# Patient Record
Sex: Female | Born: 1970 | Race: White | Hispanic: No | Marital: Single | Smoking: Former smoker
Health system: Southern US, Community
[De-identification: ages and names within clinical notes are randomized; demographics above are authoritative.]

## PROBLEM LIST (undated history)

## (undated) DIAGNOSIS — F32A Depression, unspecified: Secondary | ICD-10-CM

## (undated) DIAGNOSIS — L409 Psoriasis, unspecified: Secondary | ICD-10-CM

## (undated) DIAGNOSIS — M419 Scoliosis, unspecified: Secondary | ICD-10-CM

## (undated) DIAGNOSIS — M5136 Other intervertebral disc degeneration, lumbar region: Secondary | ICD-10-CM

## (undated) DIAGNOSIS — D259 Leiomyoma of uterus, unspecified: Secondary | ICD-10-CM

## (undated) DIAGNOSIS — N809 Endometriosis, unspecified: Secondary | ICD-10-CM

## (undated) DIAGNOSIS — M47814 Spondylosis without myelopathy or radiculopathy, thoracic region: Secondary | ICD-10-CM

## (undated) DIAGNOSIS — M51369 Other intervertebral disc degeneration, lumbar region without mention of lumbar back pain or lower extremity pain: Secondary | ICD-10-CM

## (undated) HISTORY — DX: Scoliosis, unspecified: M41.9

## (undated) HISTORY — DX: Depression, unspecified: F32.A

## (undated) HISTORY — DX: Psoriasis, unspecified: L40.9

---

## 2000-07-18 ENCOUNTER — Other Ambulatory Visit: Admission: RE | Admit: 2000-07-18 | Discharge: 2000-07-18 | Payer: Self-pay | Admitting: Obstetrics and Gynecology

## 2001-08-17 ENCOUNTER — Other Ambulatory Visit: Admission: RE | Admit: 2001-08-17 | Discharge: 2001-08-17 | Payer: Self-pay | Admitting: Obstetrics and Gynecology

## 2002-08-29 ENCOUNTER — Other Ambulatory Visit: Admission: RE | Admit: 2002-08-29 | Discharge: 2002-08-29 | Payer: Self-pay | Admitting: Obstetrics and Gynecology

## 2003-10-28 ENCOUNTER — Encounter
Admission: RE | Admit: 2003-10-28 | Discharge: 2003-12-31 | Payer: Self-pay | Admitting: Physical Medicine & Rehabilitation

## 2005-12-08 ENCOUNTER — Ambulatory Visit: Payer: Self-pay | Admitting: Licensed Clinical Social Worker

## 2005-12-23 ENCOUNTER — Ambulatory Visit: Payer: Self-pay | Admitting: Licensed Clinical Social Worker

## 2006-01-06 ENCOUNTER — Ambulatory Visit: Payer: Self-pay | Admitting: Licensed Clinical Social Worker

## 2006-01-10 ENCOUNTER — Ambulatory Visit: Payer: Self-pay | Admitting: Licensed Clinical Social Worker

## 2006-01-11 ENCOUNTER — Ambulatory Visit: Payer: Self-pay | Admitting: Internal Medicine

## 2006-01-20 ENCOUNTER — Emergency Department (HOSPITAL_COMMUNITY): Admission: EM | Admit: 2006-01-20 | Discharge: 2006-01-20 | Payer: Self-pay | Admitting: Emergency Medicine

## 2006-01-26 ENCOUNTER — Other Ambulatory Visit (HOSPITAL_COMMUNITY): Admission: RE | Admit: 2006-01-26 | Discharge: 2006-04-26 | Payer: Self-pay | Admitting: Psychiatry

## 2006-01-27 ENCOUNTER — Ambulatory Visit: Payer: Self-pay | Admitting: Internal Medicine

## 2006-02-08 ENCOUNTER — Ambulatory Visit: Payer: Self-pay | Admitting: Psychiatry

## 2006-03-29 ENCOUNTER — Ambulatory Visit: Payer: Self-pay | Admitting: Psychiatry

## 2006-07-24 ENCOUNTER — Ambulatory Visit: Payer: Self-pay | Admitting: Internal Medicine

## 2006-10-09 ENCOUNTER — Ambulatory Visit (HOSPITAL_COMMUNITY): Payer: Self-pay | Admitting: Psychiatry

## 2006-10-12 ENCOUNTER — Ambulatory Visit (HOSPITAL_COMMUNITY): Admission: RE | Admit: 2006-10-12 | Discharge: 2006-10-12 | Payer: Self-pay | Admitting: Obstetrics and Gynecology

## 2006-11-21 ENCOUNTER — Ambulatory Visit (HOSPITAL_COMMUNITY): Payer: Self-pay | Admitting: Psychiatry

## 2006-12-15 DIAGNOSIS — F329 Major depressive disorder, single episode, unspecified: Secondary | ICD-10-CM

## 2007-02-06 ENCOUNTER — Ambulatory Visit (HOSPITAL_COMMUNITY): Payer: Self-pay | Admitting: Psychiatry

## 2008-12-16 ENCOUNTER — Emergency Department (HOSPITAL_COMMUNITY): Admission: EM | Admit: 2008-12-16 | Discharge: 2008-12-16 | Payer: Self-pay | Admitting: Emergency Medicine

## 2010-05-16 ENCOUNTER — Encounter: Payer: Self-pay | Admitting: Obstetrics and Gynecology

## 2010-07-31 LAB — URINALYSIS, ROUTINE W REFLEX MICROSCOPIC
Glucose, UA: NEGATIVE mg/dL
Specific Gravity, Urine: 1.03 (ref 1.005–1.030)
pH: 5 (ref 5.0–8.0)

## 2010-07-31 LAB — URINE MICROSCOPIC-ADD ON
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
## 2010-09-10 NOTE — Group Therapy Note (Signed)
REFERRING PHYSICIAN:  Patrica Duel, M.D.   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  She  was treated in a thoracolumbar sacral orthosis for four years while a  teenager.  Has had onset of back pain since her senior year of high school,  which has been intermittent since that time.  More recently, she has  developed leg pain, but no associated falls, night pain, or loss of bowel  and bladder function.  She has had what she describes as a combination of  pain, numbness, and weakness of the legs, which she states averages 3/10.  She was having worse pain before, but now that her primary doctor has her on  a combination of Ultram 50 mg one p.o. t.i.d. and Cymbalta 60 mg p.o. daily,  these symptoms have largely improved.  Her pain is improved with rest, heat,  and medication and made worse with excessive walking, bending, sitting, and  working.  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   In the past, she has had a sacroiliac injection per Dr. Luiz Blare, which helped  back in 2001.  She has had an open MRI of the lumbar spine without  intravenous contrast on Aug 31, 2002, showing some degenerative disk disease  at L5-S1 and a central subligamentous protrusion at L5-S1 without evidence  of neural compression.   She does not recall any EMG or nerve conduction studies.   Other past medical history significant for depression and anxiety which did  not respond very well to Lexapro, but responded well to Cymbalta.   MEDICAL SENSITIVITIES:  NAPROXEN causes some diarrhea.   FAMILY HISTORY:  Significant for heart disease, cancer, and high blood  pressure.   HABITS:  Smokes a half of a pack a day for 10 years.  No history of illegal  drug use, drug conviction, DUIs, or alcohol abuse.   REVIEW OF SYSTEMS:  Anxiety and depression largely improved since Cymbalta.  Weakness and numbness symptoms as described above.  Spasms in the legs  mainly  between the knees and hips.  Swelling, excessive sweating, and some  weight gain, but admits to decreased activity level.   PHYSICAL EXAMINATION:  Blood pressure 139/72, pulse 111, respiratory rate  14, O2 saturation 98%.  Gait is normal.  Affect is alert.  Appearance is  normal.  BACK:  She does have a scoliotic curve most noticeable in a forward  flexed position.  She was examined in the presence of Darlen Round,  R.N.  She is convexed to the right in the thoracic area and to the left in  the lumbar area.  She has been measured with spine films at 35 degrees  thoracic curve and 27 degrees lumbar curve.   She has full strength in bilateral upper and lower extremities.  She has no  tenderness to palpation over the fibromyalgia tender points.  She has no  evident swelling in the knees, hips, ankles, hands, or wrists.  She has full  range of motion in bilateral upper and lower extremities.  She has normal  deep tendon reflexes in bilateral upper and lower extremities.  She is able  to do toe walk and heel walk without difficulty.  Her spine range of motion  is full.  She feels a bit stiff, however, with hyperextension, but appears  to have full range of motion.  Her Faber's test is positive at the right hip  and left  SI joint with passive right hip external rotation.   IMPRESSION:  Chronic low back pain in the setting of history of juvenile  scoliosis.  The most likely etiology would be other lumbar facet arthropathy  versus sacroiliac joint arthropathy.  She states she does have a leg length  discrepancy as a setup for this.  No evidence of significant radicular  symptoms or discogenic symptoms.  No evidence of fibromyalgia or myofascial  pain syndrome.   RECOMMENDATIONS:  1. I think she is well controlled on her current medication regimen and     these should prove good long-term medications for her.  2. She can benefit from a core stabilization program.  Her last physical      therapy was done in 2001 or 2002 and consisted of traction, which she did     not tolerate very well, as well as some exercises, but she does not     recall getting any intensive core strengthening program.  This could be     followed up with a more community-based Pilates or yoga program.  I also     think she can benefit from an aerobic exercise program and will have the     therapist address this with her.  3. I will see her back after she finishes up with her physical therapy.     Will likely not need any further visits after that if all goes well.  I     think her medications can be managed by Dr. Nobie Putnam as he has been doing     and she is comfortable with this as well.     Erick Colace, M.D.   AEK/MedQ  D:  11/27/2003 14:33:22  T:  11/27/2003 15:10:46  Job #:  [id number]   cc:   Patrica Duel, M.D.  [address]  [place]  [place] [address]  Fax: [phone]   Harvie Junior, M.D.  [address]  [place]  [place] [address]  Fax: [phone]   Claris Gower, P.T.  [facility]

## 2010-11-29 ENCOUNTER — Other Ambulatory Visit (HOSPITAL_COMMUNITY): Payer: Self-pay | Admitting: Internal Medicine

## 2010-11-29 DIAGNOSIS — M549 Dorsalgia, unspecified: Secondary | ICD-10-CM

## 2010-12-01 ENCOUNTER — Ambulatory Visit (HOSPITAL_COMMUNITY)
Admission: RE | Admit: 2010-12-01 | Discharge: 2010-12-01 | Disposition: A | Discharge status: Home / Self Care | Payer: BC Managed Care – PPO | Source: Ambulatory Visit | Attending: Internal Medicine | Admitting: Internal Medicine

## 2010-12-01 DIAGNOSIS — M51379 Other intervertebral disc degeneration, lumbosacral region without mention of lumbar back pain or lower extremity pain: Secondary | ICD-10-CM | POA: Insufficient documentation

## 2010-12-01 DIAGNOSIS — M545 Low back pain, unspecified: Secondary | ICD-10-CM | POA: Insufficient documentation

## 2010-12-01 DIAGNOSIS — M79609 Pain in unspecified limb: Secondary | ICD-10-CM | POA: Insufficient documentation

## 2010-12-01 DIAGNOSIS — M5137 Other intervertebral disc degeneration, lumbosacral region: Secondary | ICD-10-CM | POA: Insufficient documentation

## 2010-12-01 DIAGNOSIS — M549 Dorsalgia, unspecified: Secondary | ICD-10-CM

## 2013-03-26 ENCOUNTER — Other Ambulatory Visit (HOSPITAL_COMMUNITY): Payer: Self-pay | Admitting: Internal Medicine

## 2013-03-26 DIAGNOSIS — N949 Unspecified condition associated with female genital organs and menstrual cycle: Secondary | ICD-10-CM

## 2013-03-28 ENCOUNTER — Ambulatory Visit (HOSPITAL_COMMUNITY): Payer: BC Managed Care – PPO | Attending: Internal Medicine

## 2013-06-07 ENCOUNTER — Other Ambulatory Visit: Payer: Self-pay | Admitting: Family

## 2014-12-25 ENCOUNTER — Ambulatory Visit (INDEPENDENT_AMBULATORY_CARE_PROVIDER_SITE_OTHER): Payer: Self-pay | Admitting: Obstetrics & Gynecology

## 2014-12-25 ENCOUNTER — Other Ambulatory Visit (HOSPITAL_COMMUNITY)
Admission: RE | Admit: 2014-12-25 | Discharge: 2014-12-25 | Disposition: A | Discharge status: Home / Self Care | Payer: Self-pay | Source: Ambulatory Visit | Attending: Obstetrics & Gynecology | Admitting: Obstetrics & Gynecology

## 2014-12-25 ENCOUNTER — Encounter: Payer: Self-pay | Admitting: Obstetrics & Gynecology

## 2014-12-25 VITALS — BP 110/80 | HR 72 | Ht 62.0 in | Wt 131.0 lb

## 2014-12-25 DIAGNOSIS — N926 Irregular menstruation, unspecified: Secondary | ICD-10-CM

## 2014-12-25 DIAGNOSIS — Z1151 Encounter for screening for human papillomavirus (HPV): Secondary | ICD-10-CM | POA: Insufficient documentation

## 2014-12-25 DIAGNOSIS — Z01419 Encounter for gynecological examination (general) (routine) without abnormal findings: Secondary | ICD-10-CM | POA: Insufficient documentation

## 2014-12-25 DIAGNOSIS — N941 Dyspareunia: Secondary | ICD-10-CM

## 2014-12-25 DIAGNOSIS — D259 Leiomyoma of uterus, unspecified: Secondary | ICD-10-CM

## 2014-12-25 DIAGNOSIS — Z124 Encounter for screening for malignant neoplasm of cervix: Secondary | ICD-10-CM

## 2014-12-25 DIAGNOSIS — IMO0002 Reserved for concepts with insufficient information to code with codable children: Secondary | ICD-10-CM

## 2014-12-25 DIAGNOSIS — R102 Pelvic and perineal pain: Secondary | ICD-10-CM

## 2014-12-25 MED ORDER — HYDROCODONE-ACETAMINOPHEN 5-325 MG PO TABS: 1.0000 | ORAL_TABLET | Freq: Four times a day (QID) | ORAL | Status: DC | PRN

## 2014-12-25 NOTE — Progress Notes (Signed)
Patient ID: [name] [name], female   DOB: 04/25/1971, 44 y.o.   MRN: [id number] Chief Complaint  Patient presents with  . new gyn visit    no period since end of May. cramp pain stomach and hip pain.    Blood pressure 110/80, pulse 72, height 5\' 2"  (1.575 m), weight 131 lb (59.421 kg), last menstrual period 09/22/2014.  44 y.o. No obstetric history on file. Patient's last menstrual period was 09/22/2014. The current method of family planning is vasectomy.  Subjective Pt with increasing pelvic pain 1 year or so, increasing in frequency and intensity Achy now sharp affects back and hips Was told some years ago she had fibroids unsure of size +dyspareunia Periods are regularly irregular  Objective Abdomen soft non distended +BS mass effect felt deep Vulva:  normal appearing vulva with no masses, tenderness or lesions Vagina:  normal mucosa, no discharge Cervix:  no bleeding following Pap, no cervical motion tenderness, no lesions and Pap obtained Uterus:  enlarged posteriorly displaced, somewhat soft like degenerating or adenomyosis 16 weeks size Adnexa: ovaries:present,  Hard to tell if uterus or ovaries are fillling pelvis but mass from sidewall to sidewall    Pertinent ROS No burning with urination, frequency or urgency No nausea, vomiting or diarrhea Nor fever chills or other constitutional symptoms   Labs or studies Sonogram to be done next week   Current outpatient prescriptions:  .  ibuprofen (ADVIL,MOTRIN) 800 MG tablet, Take 800 mg by mouth every 8 (eight) hours as needed., Disp: , Rfl:  .  HYDROcodone-acetaminophen (NORCO/VICODIN) 5-325 MG per tablet, Take 1 tablet by mouth every 6 (six) hours as needed., Disp: 30 tablet, Rfl: 0  Impression Diagnoses this Encounter::   ICD-9-CM ICD-10-CM   1. Uterine leiomyoma, unspecified location 218.9 D25.9 Cytology - PAP     US Pelvis Complete     US Transvaginal Non-OB  2. Pap smear for cervical cancer screening V76.2 Z12.4    3. Pelvic pain in female 625.9 R10.2   4. Dyspareunia 625.0 N94.1   5. Irregular periods 626.4 N92.6     Established relevant diagnosis(es): none  Plan/Recommendations: Meds ordered this encounter  Medications  . ibuprofen (ADVIL,MOTRIN) 800 MG tablet    Sig: Take 800 mg by mouth every 8 (eight) hours as needed.  [name] HYDROcodone-acetaminophen (NORCO/VICODIN) 5-325 MG per tablet    Sig: Take 1 tablet by mouth every 6 (six) hours as needed.    Dispense:  30 tablet    Refill:  0    Labs or Scans Ordered: Orders Placed This Encounter  Procedures  . US Pelvis Complete  . US Transvaginal Non-OB      Follow up Return in about 1 week (around 01/01/2015) for GYN sono, Follow up, with Dr Elonda Husky.         All questions were answered.  Past Medical History  Diagnosis Date  . Scoliosis     History reviewed. No pertinent past surgical history.  OB History    No data available      Allergies  Allergen Reactions  . Meperidine Hcl     Social History   Social History  . Marital Status: Married    Spouse Name: N/A  . Number of Children: N/A  . Years of Education: N/A   Social History Main Topics  . Smoking status: Current Every Day Smoker -- 1.00 packs/day    Types: Cigarettes  . Smokeless tobacco: Current User  . Alcohol Use: None  . Drug Use:  None  . Sexual Activity: Yes   Other Topics Concern  . None   Social History Narrative  . None    Family History  Problem Relation Age of Onset  . Hypertension Mother   . Other Mother     lumpectomy  . Heart disease Mother   . Other Sister     lumpectomy  . Heart disease Sister

## 2014-12-31 ENCOUNTER — Telehealth: Payer: Self-pay | Admitting: Obstetrics & Gynecology

## 2014-12-31 ENCOUNTER — Telehealth: Payer: Self-pay | Admitting: *Deleted

## 2014-12-31 LAB — CYTOLOGY - PAP

## 2014-12-31 MED ORDER — TRAMADOL HCL 50 MG PO TABS: 50.0000 mg | ORAL_TABLET | Freq: Four times a day (QID) | ORAL | Status: DC | PRN

## 2014-12-31 NOTE — Telephone Encounter (Signed)
Pt states hydrocodone caused swelling, itching, and rash. Requesting Tramadol instead. Please advise.

## 2014-12-31 NOTE — Telephone Encounter (Signed)
Allergic to hydrocodone Switched at pt request to tramadol

## 2014-12-31 NOTE — Telephone Encounter (Signed)
Pt informed Tramadol RX at front for pick up.

## 2015-01-01 ENCOUNTER — Ambulatory Visit (INDEPENDENT_AMBULATORY_CARE_PROVIDER_SITE_OTHER): Payer: Self-pay

## 2015-01-01 ENCOUNTER — Encounter: Payer: Self-pay | Admitting: Obstetrics & Gynecology

## 2015-01-01 ENCOUNTER — Ambulatory Visit (INDEPENDENT_AMBULATORY_CARE_PROVIDER_SITE_OTHER): Payer: Self-pay | Admitting: Obstetrics & Gynecology

## 2015-01-01 VITALS — BP 118/60 | HR 76 | Wt 132.4 lb

## 2015-01-01 DIAGNOSIS — D259 Leiomyoma of uterus, unspecified: Secondary | ICD-10-CM

## 2015-01-01 DIAGNOSIS — R102 Pelvic and perineal pain: Secondary | ICD-10-CM

## 2015-01-01 DIAGNOSIS — N9419 Other specified dyspareunia: Secondary | ICD-10-CM

## 2015-01-01 DIAGNOSIS — N941 Dyspareunia: Secondary | ICD-10-CM

## 2015-01-01 NOTE — Progress Notes (Signed)
Patient ID: [name] [name], female   DOB: Apr 06, 1971, 44 y.o.   MRN: [id number] Preoperative History and Physical  [name] [name] is a 44 y.o. No obstetric history on file. with Patient's last menstrual period was 09/22/2014. admitted for a abdominal hysterecomy(with removal of the cervix) bilateral salpingectomy and preservation of the ovaries.  Pt with increasing pelvic pain 1 year or so, increasing in frequency and intensity Achy now sharp affects back and hips Was told some years ago she had fibroids unsure of size +dyspareunia Periods are regularly irregular  See sonogram report below  PMH:    Past Medical History  Diagnosis Date  . Scoliosis     PSH:    History reviewed. No pertinent past surgical history.  POb/GynH:      OB History    No data available      SH:   Social History  Substance Use Topics  . Smoking status: Current Every Day Smoker -- 1.00 packs/day    Types: Cigarettes  . Smokeless tobacco: Current User  . Alcohol Use: None    FH:    Family History  Problem Relation Age of Onset  . Hypertension Mother   . Other Mother     lumpectomy  . Heart disease Mother   . Other Sister     lumpectomy  . Heart disease Sister      Allergies:  Allergies  Allergen Reactions  . Meperidine Hcl     Medications:       Current outpatient prescriptions:  .  ibuprofen (ADVIL,MOTRIN) 800 MG tablet, Take 800 mg by mouth every 8 (eight) hours as needed., Disp: , Rfl:  .  traMADol (ULTRAM) 50 MG tablet, Take 1 tablet (50 mg total) by mouth every 6 (six) hours as needed., Disp: 30 tablet, Rfl: 0  Review of Systems:   Review of Systems  Constitutional: Negative for fever, chills, weight loss, malaise/fatigue and diaphoresis.  HENT: Negative for hearing loss, ear pain, nosebleeds, congestion, sore throat, neck pain, tinnitus and ear discharge.   Eyes: Negative for blurred vision, double vision, photophobia, pain, discharge and redness.  Respiratory: Negative for cough,  hemoptysis, sputum production, shortness of breath, wheezing and stridor.   Cardiovascular: Negative for chest pain, palpitations, orthopnea, claudication, leg swelling and PND.  Gastrointestinal: Positive for abdominal pain. Negative for heartburn, nausea, vomiting, diarrhea, constipation, blood in stool and melena.  Genitourinary: Negative for dysuria, urgency, frequency, hematuria and flank pain.  Musculoskeletal: Negative for myalgias, back pain, joint pain and falls.  Skin: Negative for itching and rash.  Neurological: Negative for dizziness, tingling, tremors, sensory change, speech change, focal weakness, seizures, loss of consciousness, weakness and headaches.  Endo/Heme/Allergies: Negative for environmental allergies and polydipsia. Does not bruise/bleed easily.  Psychiatric/Behavioral: Negative for depression, suicidal ideas, hallucinations, memory loss and substance abuse. The patient is not nervous/anxious and does not have insomnia.      PHYSICAL EXAM:  Blood pressure 118/60, pulse 76, weight 132 lb 6.4 oz (60.056 kg), last menstrual period 09/22/2014.    Vitals reviewed. Constitutional: She is oriented to person, place, and time. She appears well-developed and well-nourished.  HENT:  Head: Normocephalic and atraumatic.  Right Ear: External ear normal.  Left Ear: External ear normal.  Nose: Nose normal.  Mouth/Throat: Oropharynx is clear and moist.  Eyes: Conjunctivae and EOM are normal. Pupils are equal, round, and reactive to light. Right eye exhibits no discharge. Left eye exhibits no discharge. No scleral icterus.  Neck: Normal range of  motion. Neck supple. No tracheal deviation present. No thyromegaly present.  Cardiovascular: Normal rate, regular rhythm, normal heart sounds and intact distal pulses.  Exam reveals no gallop and no friction rub.   No murmur heard. Respiratory: Effort normal and breath sounds normal. No respiratory distress. She has no wheezes. She has no  rales. She exhibits no tenderness.  GI: Soft. Bowel sounds are normal. She exhibits no distension and no mass. There is tenderness. There is no rebound and no guarding.  Genitourinary:       Vulva is normal without lesions Vagina is pink moist without discharge Cervix normal in appearance and pap is normal Uterus is 10 weeks size filling pelvis side to side Adnexa is negative with normal sized ovaries by sonogram  Musculoskeletal: Normal range of motion. She exhibits no edema and no tenderness.  Neurological: She is alert and oriented to person, place, and time. She has normal reflexes. She displays normal reflexes. No cranial nerve deficit. She exhibits normal muscle tone. Coordination normal.  Skin: Skin is warm and dry. No rash noted. No erythema. No pallor.  Psychiatric: She has a normal mood and affect. Her behavior is normal. Judgment and thought content normal.    Labs: Results for orders placed or performed in visit on 12/25/14 (from the past 336 hour(s))  Cytology - PAP   Collection Time: 12/25/14 12:00 AM  Result Value Ref Range   CYTOLOGY - PAP PAP RESULT     EKG: No orders found for this or any previous visit.  Imaging Studies: US Transvaginal Non-ob  01-08-2015   GYNECOLOGIC SONOGRAM   [name] [name] is a 44 y.o.Marland KitchenLMP 09/22/2014 for a pelvic sonogram for pelvic  mass.  Uterus                      9.6 x 5.3 x 5.8 cm,  heterogenous retroverted  uterus w/ mult fibroid  Endometrium          8.5 mm, symmetrical wnl  Right ovary             2.9 x 3.1 x 2.9 cm, wnl  Left ovary                3.5 x 2.2 x 1.07 cm, wnl    Technician Comments:  US PELVIC TA/TV: heterogenous retroverted uterus w/ mult fibroid, largest  fibroids #1) fundal pedunculated fibroid 1.8 x 1.3 x 1.7cm,(#2)post rt  intramural fibroid 2.4 x 1.3 x 2.3cm,LUS intramural fibroid 2.9 x 2.3 x 3  cm,normal ov's bilat (mobile),EEC 8.50mm    [name] 08-Jan-2015 3:05 PM  Clinical Impression and recommendations:  I have reviewed  the sonogram results above, combined with the patient's  current clinical course, below are my impressions and any appropriate  recommendations for management based on the sonographic findings.  Multiple fibroids present Ovaries are normal Endometrium normal   Christinia Lambeth H 01-08-15 3:29 PM    US Pelvis Complete  01-08-15   GYNECOLOGIC SONOGRAM   [name] [name] is a 44 y.o.Marland KitchenLMP 09/22/2014 for a pelvic sonogram for pelvic  mass.  Uterus                      9.6 x 5.3 x 5.8 cm,  heterogenous retroverted  uterus w/ mult fibroid  Endometrium          8.5 mm, symmetrical wnl  Right ovary  2.9 x 3.1 x 2.9 cm, wnl  Left ovary                3.5 x 2.2 x 1.07 cm, wnl    Technician Comments:  US PELVIC TA/TV: heterogenous retroverted uterus w/ mult fibroid, largest  fibroids #1) fundal pedunculated fibroid 1.8 x 1.3 x 1.7cm,(#2)post rt  intramural fibroid 2.4 x 1.3 x 2.3cm,LUS intramural fibroid 2.9 x 2.3 x 3  cm,normal ov's bilat (mobile),EEC 8.19mm    [name] 01/01/2015 3:05 PM  Clinical Impression and recommendations:  I have reviewed the sonogram results above, combined with the patient's  current clinical course, below are my impressions and any appropriate  recommendations for management based on the sonographic findings.  Multiple fibroids present Ovaries are normal Endometrium normal   Lynnet Hefley H 01/01/2015 3:29 PM       Assessment: Uterine leiomyoma, unspecified location  Pelvic pain in female  Dyspareunia   Plan: Plan abdominal hysterectomy and removal of both Fallopian tubes on 01/21/2015, plan is to preserve the ovaries  Pt understands the risks of surgery including but not limited t  excessive bleeding requiring transfusion or reoperation, post-operative infection requiring prolonged hospitalization or re-hospitalization and antibiotic therapy, and damage to other organs including bladder, bowel, ureters and major vessels.  The patient also understands the alternative treatment  options which were discussed in full.  All questions were answered.  Jabir Dahlem H 01/01/2015 3:41 PM   Jenean Escandon H 01/01/2015 3:40 PM

## 2015-01-01 NOTE — Progress Notes (Signed)
US PELVIC TA/TV: heterogenous retroverted uterus w/ mult fibroid, largest fibroids #1) fundal pedunculated fibroid 1.8 x 1.3 x 1.7cm,(#2)post rt intramural fibroid 2.4 x 1.3 x 2.3cm,LUS intramural fibroid 2.9 x 2.3 x 3 cm,normal ov's bilat (mobile),EEC 8.29mm

## 2015-01-05 ENCOUNTER — Other Ambulatory Visit: Payer: Self-pay | Admitting: Obstetrics & Gynecology

## 2015-01-11 ENCOUNTER — Other Ambulatory Visit: Payer: Self-pay | Admitting: Obstetrics & Gynecology

## 2015-01-13 ENCOUNTER — Telehealth: Payer: Self-pay | Admitting: *Deleted

## 2015-01-13 ENCOUNTER — Other Ambulatory Visit: Payer: Self-pay | Admitting: Obstetrics & Gynecology

## 2015-01-13 NOTE — Telephone Encounter (Signed)
It was e prescribed 1 week ago, 9/13

## 2015-01-13 NOTE — Telephone Encounter (Signed)
Pt informed Dr. Elonda Husky did not refill Tramadol. Pt states what else could Dr. Elonda Husky recommend for the abdominal pain until her surgery on 01/21/2015? Pt states she has had to take 5 tablets of Tramadol daily to endure the pain.

## 2015-01-13 NOTE — Telephone Encounter (Signed)
Tramadol prescription was just refilled 01/06/2015

## 2015-01-14 ENCOUNTER — Telehealth: Payer: Self-pay | Admitting: Obstetrics & Gynecology

## 2015-01-14 MED ORDER — KETOROLAC TROMETHAMINE 10 MG PO TABS: 10.0000 mg | ORAL_TABLET | Freq: Three times a day (TID) | ORAL | Status: DC | PRN

## 2015-01-14 NOTE — Telephone Encounter (Signed)
If she continues to take that much pain meds nothing is going to work post operatively, i e prescribed toradol for her which is non narcotic

## 2015-01-15 NOTE — Telephone Encounter (Signed)
Pt informed Toradol for pain, e-scribed to [facility], [place].

## 2015-01-16 NOTE — Patient Instructions (Signed)
Alexiah Kerman Passey  01/16/2015     @PREFPERIOPPHARMACY @   Your procedure is scheduled on  01/21/2015  Report to [facility] at  700  A.M.  Call this number if you have problems the morning of surgery:  [phone]   Remember:  Do not eat food or drink liquids after midnight.  Take these medicines the morning of surgery with A SIP OF WATER toradol, ultram.   Do not wear jewelry, make-up or nail polish.  Do not wear lotions, powders, or perfumes.  You may wear deodorant.  Do not shave 48 hours prior to surgery.  Men may shave face and neck.  Do not bring valuables to the hospital.  [facility] is not responsible for any belongings or valuables.  Contacts, dentures or bridgework may not be worn into surgery.  Leave your suitcase in the car.  After surgery it may be brought to your room.  For patients admitted to the hospital, discharge time will be determined by your treatment team.  Patients discharged the day of surgery will not be allowed to drive home.   Name and phone number of your driver:   family Special instructions:  none  Please read over the following fact sheets that you were given. Pain Booklet, Coughing and Deep Breathing, Surgical Site Infection Prevention, Anesthesia Post-op Instructions and Care and Recovery After Surgery      Abdominal Hysterectomy Abdominal hysterectomy is a surgical procedure to remove your womb (uterus). Your uterus is the muscular organ that contains a developing baby. This surgery is done for many reasons. You may need an abdominal hysterectomy if you have cancer, growths (tumors), long-term pain, or bleeding. You may also have this procedure if your uterus has slipped down into your vagina (uterine prolapse). Depending on why you need an abdominal hysterectomy, you may also have other reproductive organs removed. These could include the part of your vagina that connects with your uterus (cervix), the organs that make eggs (ovaries), and  the tubes that connect the ovaries to the uterus (fallopian tubes). LET [facility] CARE PROVIDER KNOW ABOUT:   Any allergies you have.  All medicines you are taking, including vitamins, herbs, eye drops, creams, and over-the-counter medicines.  Previous problems you or members of your family have had with the use of anesthetics.  Any blood disorders you have.  Previous surgeries you have had.  Medical conditions you have. RISKS AND COMPLICATIONS Generally, this is a safe procedure. However, as with any procedure, problems can occur. Infection is the most common problem after an abdominal hysterectomy. Other possible problems include:  Bleeding.  Formation of blood clots that may break free and travel to your lungs.  Injury to other organs near your uterus.  Nerve injury causing nerve pain.  Decreased interest in sex or pain during sexual intercourse. BEFORE THE PROCEDURE  Abdominal hysterectomy is a major surgical procedure. It can affect the way you feel about yourself. Talk to your health care provider about the physical and emotional changes hysterectomy may cause.  You may need to have blood work and X-rays done before surgery.  Quit smoking if you smoke. Ask your health care provider for help if you are struggling to quit.  Stop taking medicines that thin your blood as directed by your health care provider.  You may be instructed to take antibiotic medicines or laxatives before surgery.  Do not eat or drink anything for 6-8 hours before surgery.  Take your  regular medicines with a small sip of water.  Bathe or shower the night or morning before surgery. PROCEDURE  Abdominal hysterectomy is done in the operating room at the hospital.  In most cases, you will be given a medicine that makes you go to sleep (general anesthetic).  The surgeon will make a cut (incision) through the skin in your lower belly.  The incision may be about 5-7 inches long. It may go  side-to-side or up-and-down.  The surgeon will move aside the body tissue that covers your uterus. The surgeon will then carefully take out your uterus along with any of your other reproductive organs that need to be removed.  Bleeding will be controlled with clamps or sutures.  The surgeon will close your incision with sutures or metal clips. AFTER THE PROCEDURE  You will have some pain immediately after the procedure.  You will be given pain medicine in the recovery room.  You will be taken to your hospital room when you have recovered from the anesthesia.  You may need to stay in the hospital for 2-5 days.  You will be given instructions for recovery at home. Document Released: 04/16/2013 Document Reviewed: 04/16/2013 [facility] Patient Information 2015 [place], [place]. This information is not intended to replace advice given to you by your health care provider. Make sure you discuss any questions you have with your health care provider. PATIENT INSTRUCTIONS POST-ANESTHESIA  IMMEDIATELY FOLLOWING SURGERY:  Do not drive or operate machinery for the first twenty four hours after surgery.  Do not make any important decisions for twenty four hours after surgery or while taking narcotic pain medications or sedatives.  If you develop intractable nausea and vomiting or a severe headache please notify your doctor immediately.  FOLLOW-UP:  Please make an appointment with your surgeon as instructed. You do not need to follow up with anesthesia unless specifically instructed to do so.  WOUND CARE INSTRUCTIONS (if applicable):  Keep a dry clean dressing on the anesthesia/puncture wound site if there is drainage.  Once the wound has quit draining you may leave it open to air.  Generally you should leave the bandage intact for twenty four hours unless there is drainage.  If the epidural site drains for more than 36-48 hours please call the anesthesia department.  QUESTIONS?:  Please feel free to call  your physician or the hospital operator if you have any questions, and they will be happy to assist you.

## 2015-01-19 ENCOUNTER — Encounter: Payer: Self-pay | Admitting: Obstetrics and Gynecology

## 2015-01-19 ENCOUNTER — Encounter (HOSPITAL_COMMUNITY): Payer: Self-pay

## 2015-01-19 ENCOUNTER — Telehealth: Payer: Self-pay | Admitting: *Deleted

## 2015-01-19 ENCOUNTER — Encounter (HOSPITAL_COMMUNITY)
Admission: RE | Admit: 2015-01-19 | Discharge: 2015-01-19 | Disposition: A | Discharge status: Home / Self Care | Payer: Self-pay | Source: Ambulatory Visit | Attending: Obstetrics & Gynecology | Admitting: Obstetrics & Gynecology

## 2015-01-19 DIAGNOSIS — Z01818 Encounter for other preprocedural examination: Secondary | ICD-10-CM | POA: Insufficient documentation

## 2015-01-19 DIAGNOSIS — N852 Hypertrophy of uterus: Secondary | ICD-10-CM | POA: Insufficient documentation

## 2015-01-19 DIAGNOSIS — R102 Pelvic and perineal pain: Secondary | ICD-10-CM | POA: Insufficient documentation

## 2015-01-19 HISTORY — DX: Leiomyoma of uterus, unspecified: D25.9

## 2015-01-19 HISTORY — DX: Spondylosis without myelopathy or radiculopathy, thoracic region: M47.814

## 2015-01-19 LAB — CBC
HCT: 39.5 % (ref 36.0–46.0)
HEMOGLOBIN: 13.9 g/dL (ref 12.0–15.0)
MCH: 31.4 pg (ref 26.0–34.0)
MCHC: 35.2 g/dL (ref 30.0–36.0)
MCV: 89.4 fL (ref 78.0–100.0)
PLATELETS: 221 10*3/uL (ref 150–400)
RBC: 4.42 MIL/uL (ref 3.87–5.11)
RDW: 12.6 % (ref 11.5–15.5)
WBC: 11.7 10*3/uL — AB (ref 4.0–10.5)

## 2015-01-19 LAB — HCG, SERUM, QUALITATIVE: PREG SERUM: NEGATIVE

## 2015-01-19 LAB — BASIC METABOLIC PANEL
ANION GAP: 7 (ref 5–15)
BUN: 13 mg/dL (ref 6–20)
CO2: 24 mmol/L (ref 22–32)
Calcium: 8.6 mg/dL — ABNORMAL LOW (ref 8.9–10.3)
Chloride: 109 mmol/L (ref 101–111)
Creatinine, Ser: 0.66 mg/dL (ref 0.44–1.00)
Glucose, Bld: 106 mg/dL — ABNORMAL HIGH (ref 65–99)
POTASSIUM: 3.5 mmol/L (ref 3.5–5.1)
SODIUM: 140 mmol/L (ref 135–145)

## 2015-01-19 LAB — ABO/RH: ABO/RH(D): AB NEG

## 2015-01-19 NOTE — Telephone Encounter (Signed)
Pt aware that note will be in front office for her to pick up tomorrow.

## 2015-01-21 ENCOUNTER — Inpatient Hospital Stay (HOSPITAL_COMMUNITY): Payer: Self-pay | Admitting: Anesthesiology

## 2015-01-21 ENCOUNTER — Observation Stay (HOSPITAL_COMMUNITY)
Admission: RE | Admit: 2015-01-21 | Discharge: 2015-01-22 | Disposition: A | Discharge status: Home / Self Care | Payer: Self-pay | Source: Ambulatory Visit | Attending: Obstetrics & Gynecology | Admitting: Obstetrics & Gynecology

## 2015-01-21 ENCOUNTER — Encounter (HOSPITAL_COMMUNITY)
Admission: RE | Disposition: A | Discharge status: Home / Self Care | Payer: Self-pay | Source: Ambulatory Visit | Attending: Obstetrics & Gynecology

## 2015-01-21 ENCOUNTER — Inpatient Hospital Stay (HOSPITAL_COMMUNITY): Payer: Self-pay

## 2015-01-21 ENCOUNTER — Encounter (HOSPITAL_COMMUNITY): Payer: Self-pay | Admitting: *Deleted

## 2015-01-21 DIAGNOSIS — N801 Endometriosis of ovary: Secondary | ICD-10-CM

## 2015-01-21 DIAGNOSIS — N803 Endometriosis of pelvic peritoneum: Secondary | ICD-10-CM | POA: Insufficient documentation

## 2015-01-21 DIAGNOSIS — D251 Intramural leiomyoma of uterus: Secondary | ICD-10-CM

## 2015-01-21 DIAGNOSIS — N8320 Unspecified ovarian cysts: Secondary | ICD-10-CM

## 2015-01-21 DIAGNOSIS — N840 Polyp of corpus uteri: Secondary | ICD-10-CM

## 2015-01-21 DIAGNOSIS — F17210 Nicotine dependence, cigarettes, uncomplicated: Secondary | ICD-10-CM | POA: Insufficient documentation

## 2015-01-21 DIAGNOSIS — Z09 Encounter for follow-up examination after completed treatment for conditions other than malignant neoplasm: Secondary | ICD-10-CM

## 2015-01-21 DIAGNOSIS — N941 Dyspareunia: Secondary | ICD-10-CM | POA: Insufficient documentation

## 2015-01-21 DIAGNOSIS — N946 Dysmenorrhea, unspecified: Secondary | ICD-10-CM | POA: Insufficient documentation

## 2015-01-21 DIAGNOSIS — R102 Pelvic and perineal pain: Principal | ICD-10-CM | POA: Insufficient documentation

## 2015-01-21 DIAGNOSIS — N830 Follicular cyst of ovary: Secondary | ICD-10-CM

## 2015-01-21 DIAGNOSIS — Z9079 Acquired absence of other genital organ(s): Secondary | ICD-10-CM

## 2015-01-21 DIAGNOSIS — F329 Major depressive disorder, single episode, unspecified: Secondary | ICD-10-CM | POA: Insufficient documentation

## 2015-01-21 DIAGNOSIS — Z79899 Other long term (current) drug therapy: Secondary | ICD-10-CM | POA: Insufficient documentation

## 2015-01-21 DIAGNOSIS — Z90722 Acquired absence of ovaries, bilateral: Secondary | ICD-10-CM

## 2015-01-21 DIAGNOSIS — N838 Other noninflammatory disorders of ovary, fallopian tube and broad ligament: Secondary | ICD-10-CM

## 2015-01-21 DIAGNOSIS — Z90710 Acquired absence of both cervix and uterus: Secondary | ICD-10-CM

## 2015-01-21 HISTORY — PX: ABDOMINAL HYSTERECTOMY: SHX81

## 2015-01-21 HISTORY — PX: SALPINGOOPHORECTOMY: SHX82

## 2015-01-21 SURGERY — HYSTERECTOMY, ABDOMINAL
Anesthesia: General

## 2015-01-21 MED ORDER — ZOLPIDEM TARTRATE 5 MG PO TABS
5.0000 mg | ORAL_TABLET | Freq: Every evening | ORAL | Status: DC | PRN
  Administered 2015-01-22: 5 mg via ORAL
  Filled 2015-01-21: qty 1

## 2015-01-21 MED ORDER — MIDAZOLAM HCL 2 MG/2ML IJ SOLN
1.0000 mg | INTRAMUSCULAR | Status: DC | PRN
  Administered 2015-01-21 (×2): 1 mg via INTRAVENOUS
  Administered 2015-01-21: 2 mg via INTRAVENOUS
  Filled 2015-01-21: qty 2

## 2015-01-21 MED ORDER — FENTANYL CITRATE (PF) 250 MCG/5ML IJ SOLN
INTRAMUSCULAR | Status: AC
  Filled 2015-01-21: qty 25

## 2015-01-21 MED ORDER — THROMBIN 5000 UNITS EX SOLR
CUTANEOUS | Status: AC
  Filled 2015-01-21: qty 5000

## 2015-01-21 MED ORDER — LACTATED RINGERS IV SOLN
INTRAVENOUS | Status: DC
  Administered 2015-01-21 (×3): via INTRAVENOUS

## 2015-01-21 MED ORDER — MIDAZOLAM HCL 2 MG/2ML IJ SOLN
INTRAMUSCULAR | Status: AC
  Filled 2015-01-21: qty 4

## 2015-01-21 MED ORDER — DOCUSATE SODIUM 100 MG PO CAPS
100.0000 mg | ORAL_CAPSULE | Freq: Two times a day (BID) | ORAL | Status: DC
  Administered 2015-01-21 – 2015-01-22 (×2): 100 mg via ORAL
  Filled 2015-01-21 (×2): qty 1

## 2015-01-21 MED ORDER — BISACODYL 10 MG RE SUPP: 10.0000 mg | Freq: Every day | RECTAL | Status: DC | PRN

## 2015-01-21 MED ORDER — PROPOFOL 10 MG/ML IV BOLUS
INTRAVENOUS | Status: AC
  Filled 2015-01-21: qty 20

## 2015-01-21 MED ORDER — ONDANSETRON HCL 4 MG/2ML IJ SOLN
INTRAMUSCULAR | Status: AC
  Filled 2015-01-21: qty 2

## 2015-01-21 MED ORDER — BUPIVACAINE LIPOSOME 1.3 % IJ SUSP
INTRAMUSCULAR | Status: DC | PRN
  Administered 2015-01-21: 20 mL

## 2015-01-21 MED ORDER — ROCURONIUM BROMIDE 50 MG/5ML IV SOLN
INTRAVENOUS | Status: AC
  Filled 2015-01-21: qty 1

## 2015-01-21 MED ORDER — DEXTROSE 5 % IV SOLN
2000.0000 mg | Freq: Once | INTRAVENOUS | Status: AC
  Administered 2015-01-21: 2000 mg via INTRAVENOUS

## 2015-01-21 MED ORDER — ONDANSETRON HCL 4 MG/2ML IJ SOLN: 4.0000 mg | Freq: Once | INTRAMUSCULAR | Status: DC | PRN

## 2015-01-21 MED ORDER — LIDOCAINE HCL 1 % IJ SOLN
INTRAMUSCULAR | Status: DC | PRN
  Administered 2015-01-21: 25 mg via INTRADERMAL

## 2015-01-21 MED ORDER — HYDROMORPHONE HCL 1 MG/ML IJ SOLN
2.0000 mg | INTRAMUSCULAR | Status: DC | PRN
  Administered 2015-01-21 (×3): 2 mg via INTRAVENOUS
  Administered 2015-01-21: 4 mg via INTRAVENOUS
  Administered 2015-01-22 (×2): 2 mg via INTRAVENOUS
  Filled 2015-01-21 (×3): qty 2
  Filled 2015-01-21: qty 4
  Filled 2015-01-21 (×2): qty 2

## 2015-01-21 MED ORDER — OXYCODONE-ACETAMINOPHEN 5-325 MG PO TABS
1.0000 | ORAL_TABLET | ORAL | Status: DC | PRN
  Administered 2015-01-21 – 2015-01-22 (×3): 2 via ORAL
  Administered 2015-01-22: 1 via ORAL
  Filled 2015-01-21 (×4): qty 2

## 2015-01-21 MED ORDER — 0.9 % SODIUM CHLORIDE (POUR BTL) OPTIME
TOPICAL | Status: DC | PRN
  Administered 2015-01-21: 2000 mL
  Administered 2015-01-21: 1000 mL

## 2015-01-21 MED ORDER — BUPIVACAINE LIPOSOME 1.3 % IJ SUSP
INTRAMUSCULAR | Status: AC
  Filled 2015-01-21: qty 20

## 2015-01-21 MED ORDER — ONDANSETRON HCL 4 MG/2ML IJ SOLN
4.0000 mg | Freq: Once | INTRAMUSCULAR | Status: AC
  Administered 2015-01-21: 4 mg via INTRAVENOUS

## 2015-01-21 MED ORDER — FENTANYL CITRATE (PF) 100 MCG/2ML IJ SOLN
INTRAMUSCULAR | Status: DC | PRN
  Administered 2015-01-21: 50 ug via INTRAVENOUS
  Administered 2015-01-21: 150 ug via INTRAVENOUS
  Administered 2015-01-21 (×6): 50 ug via INTRAVENOUS

## 2015-01-21 MED ORDER — CEFAZOLIN SODIUM-DEXTROSE 2-3 GM-% IV SOLR
INTRAVENOUS | Status: AC
  Filled 2015-01-21: qty 50

## 2015-01-21 MED ORDER — SENNOSIDES-DOCUSATE SODIUM 8.6-50 MG PO TABS: 1.0000 | ORAL_TABLET | Freq: Every evening | ORAL | Status: DC | PRN

## 2015-01-21 MED ORDER — GLYCOPYRROLATE 0.2 MG/ML IJ SOLN
INTRAMUSCULAR | Status: AC
  Filled 2015-01-21: qty 3

## 2015-01-21 MED ORDER — KCL IN DEXTROSE-NACL 20-5-0.45 MEQ/L-%-% IV SOLN
INTRAVENOUS | Status: DC
  Administered 2015-01-21 – 2015-01-22 (×3): via INTRAVENOUS

## 2015-01-21 MED ORDER — GLYCOPYRROLATE 0.2 MG/ML IJ SOLN
INTRAMUSCULAR | Status: DC | PRN
  Administered 2015-01-21: 0.4 mg via INTRAVENOUS

## 2015-01-21 MED ORDER — NEOSTIGMINE METHYLSULFATE 10 MG/10ML IV SOLN
INTRAVENOUS | Status: DC | PRN
  Administered 2015-01-21 (×2): 1 mg via INTRAVENOUS

## 2015-01-21 MED ORDER — PROPOFOL 10 MG/ML IV BOLUS
INTRAVENOUS | Status: DC | PRN
  Administered 2015-01-21: 140 mg via INTRAVENOUS

## 2015-01-21 MED ORDER — SODIUM CHLORIDE 0.9 % IV SOLN
8.0000 mg | Freq: Four times a day (QID) | INTRAVENOUS | Status: DC | PRN
  Filled 2015-01-21: qty 4

## 2015-01-21 MED ORDER — SODIUM CHLORIDE 0.9 % IJ SOLN
INTRAMUSCULAR | Status: AC
  Filled 2015-01-21: qty 20

## 2015-01-21 MED ORDER — ROCURONIUM BROMIDE 100 MG/10ML IV SOLN
INTRAVENOUS | Status: DC | PRN
  Administered 2015-01-21: 35 mg via INTRAVENOUS
  Administered 2015-01-21: 5 mg via INTRAVENOUS
  Administered 2015-01-21 (×3): 10 mg via INTRAVENOUS

## 2015-01-21 MED ORDER — SIMETHICONE 80 MG PO CHEW: 80.0000 mg | CHEWABLE_TABLET | Freq: Four times a day (QID) | ORAL | Status: DC | PRN

## 2015-01-21 MED ORDER — MIDAZOLAM HCL 5 MG/5ML IJ SOLN
INTRAMUSCULAR | Status: DC | PRN
  Administered 2015-01-21: 2 mg via INTRAVENOUS

## 2015-01-21 MED ORDER — KETOROLAC TROMETHAMINE 30 MG/ML IJ SOLN
30.0000 mg | Freq: Once | INTRAMUSCULAR | Status: AC
  Administered 2015-01-21: 30 mg via INTRAVENOUS
  Filled 2015-01-21: qty 1

## 2015-01-21 MED ORDER — LIDOCAINE HCL (PF) 1 % IJ SOLN
INTRAMUSCULAR | Status: AC
  Filled 2015-01-21: qty 5

## 2015-01-21 MED ORDER — MIDAZOLAM HCL 2 MG/2ML IJ SOLN
INTRAMUSCULAR | Status: AC
  Filled 2015-01-21: qty 2

## 2015-01-21 MED ORDER — NEOSTIGMINE METHYLSULFATE 10 MG/10ML IV SOLN
INTRAVENOUS | Status: AC
  Filled 2015-01-21: qty 1

## 2015-01-21 MED ORDER — ONDANSETRON HCL 4 MG PO TABS
8.0000 mg | ORAL_TABLET | Freq: Four times a day (QID) | ORAL | Status: DC | PRN
  Filled 2015-01-21: qty 2

## 2015-01-21 MED ORDER — BUPIVACAINE LIPOSOME 1.3 % IJ SUSP
20.0000 mL | Freq: Once | INTRAMUSCULAR | Status: DC
  Filled 2015-01-21: qty 20

## 2015-01-21 MED ORDER — FENTANYL CITRATE (PF) 100 MCG/2ML IJ SOLN
25.0000 ug | INTRAMUSCULAR | Status: DC | PRN
  Administered 2015-01-21 (×3): 50 ug via INTRAVENOUS
  Filled 2015-01-21 (×2): qty 2

## 2015-01-21 SURGICAL SUPPLY — 50 items
APPLIER CLIP 13 LRG OPEN (CLIP) ×4
APR CLP LRG 13 20 CLIP (CLIP) ×2
BAG HAMPER (MISCELLANEOUS) ×4 IMPLANT
CELLS DAT CNTRL 66122 CELL SVR (MISCELLANEOUS) ×2 IMPLANT
CLIP APPLIE 13 LRG OPEN (CLIP) IMPLANT
CLOTH BEACON ORANGE TIMEOUT ST (SAFETY) ×4 IMPLANT
COVER LIGHT HANDLE STERIS (MISCELLANEOUS) ×8 IMPLANT
COVER MAYO STAND XLG (DRAPE) ×4 IMPLANT
DRAPE WARM FLUID 44X44 (DRAPE) ×4 IMPLANT
DRAPE X-RAY CASS 24X20 (DRAPES) ×2 IMPLANT
DRSG OPSITE POSTOP 4X10 (GAUZE/BANDAGES/DRESSINGS) ×2 IMPLANT
DRSG OPSITE POSTOP 4X8 (GAUZE/BANDAGES/DRESSINGS) ×2 IMPLANT
DRSG TELFA 3X8 NADH (GAUZE/BANDAGES/DRESSINGS) IMPLANT
ELECT REM PT RETURN 9FT ADLT (ELECTROSURGICAL) ×4
ELECTRODE REM PT RTRN 9FT ADLT (ELECTROSURGICAL) ×2 IMPLANT
FORMALIN 10 PREFIL 480ML (MISCELLANEOUS) ×4 IMPLANT
GLOVE BIOGEL PI IND STRL 8 (GLOVE) ×2 IMPLANT
GLOVE BIOGEL PI INDICATOR 8 (GLOVE) ×2
GLOVE ECLIPSE 8.0 STRL XLNG CF (GLOVE) ×4 IMPLANT
GOWN STRL REUS W/TWL LRG LVL3 (GOWN DISPOSABLE) ×8 IMPLANT
GOWN STRL REUS W/TWL XL LVL3 (GOWN DISPOSABLE) ×4 IMPLANT
INST SET MAJOR GENERAL (KITS) ×4 IMPLANT
KIT ROOM TURNOVER APOR (KITS) ×4 IMPLANT
LIQUID BAND (GAUZE/BANDAGES/DRESSINGS) ×2 IMPLANT
MANIFOLD NEPTUNE II (INSTRUMENTS) ×4 IMPLANT
NDL HYPO 25X1 1.5 SAFETY (NEEDLE) ×2 IMPLANT
NEEDLE HYPO 25X1 1.5 SAFETY (NEEDLE) ×4 IMPLANT
NS IRRIG 1000ML POUR BTL (IV SOLUTION) ×10 IMPLANT
PACK ABDOMINAL MAJOR (CUSTOM PROCEDURE TRAY) ×4 IMPLANT
PAD ARMBOARD 7.5X6 YLW CONV (MISCELLANEOUS) ×4 IMPLANT
PAD DRESSING TELFA 3X8 NADH (GAUZE/BANDAGES/DRESSINGS) ×2 IMPLANT
RETRACTOR WND ALEXIS 18 MED (MISCELLANEOUS) IMPLANT
RETRACTOR WND ALEXIS 25 LRG (MISCELLANEOUS) IMPLANT
RTRCTR WOUND ALEXIS 18CM MED (MISCELLANEOUS) ×4
RTRCTR WOUND ALEXIS 25CM LRG (MISCELLANEOUS)
SET BASIN LINEN APH (SET/KITS/TRAYS/PACK) ×4 IMPLANT
SPONGE LAP 18X18 X RAY DECT (DISPOSABLE) ×4 IMPLANT
STAPLER VISISTAT 35W (STAPLE) ×4 IMPLANT
SUT CHROMIC 0 CT 1 (SUTURE) ×4 IMPLANT
SUT MON AB 3-0 SH 27 (SUTURE) ×8 IMPLANT
SUT PLAIN 2 0 XLH (SUTURE) ×2 IMPLANT
SUT VIC AB 0 CT1 27 (SUTURE) ×16
SUT VIC AB 0 CT1 27XBRD ANTBC (SUTURE) ×2 IMPLANT
SUT VIC AB 0 CT1 27XCR 8 STRN (SUTURE) ×4 IMPLANT
SUT VIC AB 0 CTX 36 (SUTURE) ×8
SUT VIC AB 0 CTX36XBRD ANTBCTR (SUTURE) ×2 IMPLANT
SUT VICRYL 3 0 (SUTURE) ×2 IMPLANT
SYR 20CC LL (SYRINGE) ×4 IMPLANT
TOWEL BLUE STERILE X RAY DET (MISCELLANEOUS) ×4 IMPLANT
TRAY FOLEY CATH SILVER 16FR (SET/KITS/TRAYS/PACK) ×4 IMPLANT

## 2015-01-21 NOTE — Anesthesia Postprocedure Evaluation (Signed)
  Anesthesia Post-op Note  Patient: [name] [name]  Procedure(s) Performed: Procedure(s): HYSTERECTOMY ABDOMINAL (N/A) BILATERAL SALPINGO OOPHORECTOMY (Bilateral)  Patient Location: PACU  Anesthesia Type:General  Level of Consciousness: awake, alert , oriented and patient cooperative  Airway and Oxygen Therapy: Patient Spontanous Breathing  Post-op Pain: 4 /10, moderate  Post-op Assessment: Post-op Vital signs reviewed, Patient's Cardiovascular Status Stable, Respiratory Function Stable, Patent Airway, No signs of Nausea or vomiting and Pain level controlled              Post-op Vital Signs: Reviewed and stable  Last Vitals:  Filed Vitals:   01/21/15 1245  BP:   Pulse: 79  Temp:   Resp: 8    Complications: No apparent anesthesia complications

## 2015-01-21 NOTE — Op Note (Signed)
Preoperative diagnosis:  1.  Bilateral and midline pelvic pain                                          2.  Dyspareunia, bump                                         3.  dysmenorrhea                                         4.  Pain radiates to hips and low back and feels like it is in her rectum, like a knife  Postoperative diagnosis:  Same as above + endometriosis  Procedure:  Abdominal hysterectomy with removal of both ovaries and tubes                     Intraoperative pyelogram to evaluate right ureter status  Surgeon:  Florian Buff  Assistant:    Anesthesia:  General endotracheal  Preoperative clinical summary: [name] [name] is a 44 y.o. No obstetric history on file. with Patient's last menstrual period was 09/22/2014. admitted for a abdominal hysterecomy(with removal of the cervix) bilateral salpingectomy and preservation of the ovaries.  Pt with increasing pelvic pain 1 year or so, increasing in frequency and intensity Achy now sharp affects back and hips Was told some years ago she had fibroids unsure of size +dyspareunia Periods are regularly irregular   Intraoperative findings: pt had surface and peritoneal endometriosis which caused her right adnexa to be densely adherent adherent to the right ovarian fossa putting it essentially on the right ureter near the area of the right uterosacral ligament.  The left ovary was also adherent to the left pelvic sidewall but less so a much higher it was not located in the area of the ureter on the left.  The uterus otherwise appeared to be normal.  There was peritoneal endometriosis in the posterior cul-de-sac as well  Description of operation:  Patient was taken to the operating room and placed in the supine position where she underwent general endotracheal anesthesia.  She was then prepped and draped in the usual sterile fashion and a Foley catheter was placed for continuous bladder drainage.  A Pfannenstiel skin incision was made and  carried down sharply to the rectus fascia which was scored in the midline and extended laterally.  The fascia was taken off the muscles superiorly and inferiorly without difficulty.  The muscles were divided.  The peritoneal cavity was entered.  An medium Alexis self-retaining retractor was placed.  The upper abdomen was packed away. Both uterine cornu were grasped with Coker clamps.  The left round ligament was suture ligated and coagulated with the electrocautery unit.  The left vesicouterine serosal flap was created.  An avascular window in in the peritoneum was created and the utero-ovarian ligament was cross clamped, cut and suture ligated.  The right round ligament was suture ligated and cut with the electrocautery unit.  The vesicouterine serosal flap on the right was created.  An avascular window in the peritoneum was created and the right utero-ovarian ligament was cross clamped, cut and double suture ligated.  I then removed the left  ovary by crossclamping the left infundibulopelvic ligament and then doing a double suture ligation, fore and aft.  I could not perform this on the right and left this for the end of the case.   The uterine vessels were skeletonized bilaterally.  The uterine vessels were clamped bilaterally,  then cut and suture ligated.  Two more pedicles were taken down the cervix medial to the uterine vessels.  Each pedicle was clamped cut and suture ligated with good resulting hemostasis.  Serial pedicles were taken down the cervix to the cardinal ligament.  Each pedicle was clamped cut and transfixion suture ligated.  The vagina was crossclamped and vaginal angle sutures were placed.  The vagina was then closed with interrupted figure-of-eight sutures.  The uterus and cervix were removed in total.  I then turned my attention to the right adnexa.  I painstakingly dissected the right ovary and fallopian tube off the pelvic sidewall and the right ovarian fossa almost to the corner of the  vagina.  Again I had dissected the ureter out in its entire course and I could see it throughout the tedious dissection.  However I was concerned that I had denuded the peritoneum and possibly devascularize a portion of the distal ureter because the dissection was very difficult and the tissue was quite indurated from the endometriosis.  I decided to perform an intraoperative intravenous pyelogram to evaluate the status of the ureter after I closed the peritoneum over it.  Of course this was time consuming and added a great deal of time to the case.  All pedicles were hemostatic.  I felt it important to appropriately evaluate the status of the ureter and the collecting system on the right prior to leaving the OR.  5 felt I hadn't N Barrus the ureter in any way I would've asked urology to come place a stent but I did not think that was the case.  The intravenous pyelogram was normal with good flow into the bladder and good emptying of the renal pelvis on the right.  The left was also normal.  The pelvis was irrigated vigorously and all pedicles were examined and found to be hemostatic. All specimens were sent to pathology for routine evaluation.  The Alexis self-retaining retractor was removed and the pelvis was irrigated vigorously.  All packs were removed and all counts were correct at this point x 3.  The muscles and peritoneum were reapproximated loosely.  The fascia was closed with 0 Vicryl running.  The skin was closed using 3-0 Vicryl on a Keith needle in a subcuticular fashion.  Liquid ban was then applied for additional wound integrity and to serve as a postoperative bacterial barrier.  The patient was awakened from anesthesia taken to the recovery room in good stable condition. All sponge instrument and needle counts were correct x 3.  The patient received Ancef and Toradol prophylactically preoperatively.  Estimated blood loss for the procedure was 400  cc.  Bill Yohn H 01/21/2015 12:08 PM

## 2015-01-21 NOTE — Transfer of Care (Signed)
Immediate Anesthesia Transfer of Care Note  Patient: [name] [name]  Procedure(s) Performed: Procedure(s): HYSTERECTOMY ABDOMINAL (N/A) BILATERAL SALPINGO OOPHORECTOMY (Bilateral)  Patient Location: PACU  Anesthesia Type:General  Level of Consciousness: awake and patient cooperative  Airway & Oxygen Therapy: Patient Spontanous Breathing and Patient connected to face mask oxygen  Post-op Assessment: Report given to RN, Post -op Vital signs reviewed and stable and Patient moving all extremities  Post vital signs: Reviewed and stable  Last Vitals:  Filed Vitals:   01/21/15 0830  BP: 122/71  Pulse:   Temp:   Resp: 17    Complications: No apparent anesthesia complications

## 2015-01-21 NOTE — Interval H&P Note (Signed)
History and Physical Interval Note:  01/21/2015 8:06 AM  [name] [name]  has presented today for surgery, with the diagnosis of pelvic pain uterine fibroids dyspareunia  The various methods of treatment have been discussed with the patient and family. After consideration of risks, benefits and other options for treatment, the patient has consented to  Procedure(s): HYSTERECTOMY ABDOMINAL (N/A) as a surgical intervention .  The patient's history has been reviewed, patient examined, no change in status, stable for surgery.  I have reviewed the patient's chart and labs.  Questions were answered to the patient's satisfaction.     EURE,LUTHER H

## 2015-01-21 NOTE — Anesthesia Procedure Notes (Signed)
Procedure Name: Intubation Date/Time: 01/21/2015 8:45 AM Performed by: Charmaine Downs Pre-anesthesia Checklist: Emergency Drugs available, Patient identified, Suction available and Patient being monitored Patient Re-evaluated:Patient Re-evaluated prior to inductionOxygen Delivery Method: Circle system utilized Preoxygenation: Pre-oxygenation with 100% oxygen Intubation Type: IV induction Ventilation: Oral airway inserted - appropriate to patient size and Mask ventilation without difficulty Laryngoscope Size: Mac and 3 Grade View: Grade II Tube type: Oral Tube size: 7.0 mm Number of attempts: 1 Airway Equipment and Method: Stylet and Oral airway Placement Confirmation: ETT inserted through vocal cords under direct vision,  breath sounds checked- equal and bilateral and positive ETCO2 Secured at: 22 cm Tube secured with: Tape Dental Injury: Teeth and Oropharynx as per pre-operative assessment

## 2015-01-21 NOTE — Transfer of Care (Signed)
Immediate Anesthesia Transfer of Care Note  Patient: [name] [name]  Procedure(s) Performed: Procedure(s): HYSTERECTOMY ABDOMINAL (N/A) BILATERAL SALPINGO OOPHORECTOMY (Bilateral)  Patient Location: PACU  Anesthesia Type:General  Level of Consciousness: awake and patient cooperative  Airway & Oxygen Therapy: Patient Spontanous Breathing and Patient connected to face mask oxygen  Post-op Assessment: Report given to RN, Post -op Vital signs reviewed and stable and Patient moving all extremities  Post vital signs: Reviewed and stable  Last Vitals:  Filed Vitals:   01/21/15 1215  BP: 109/52  Pulse: 73  Temp:   Resp: 9    Complications: No apparent anesthesia complications

## 2015-01-21 NOTE — H&P (Signed)
Patient ID: [name] [name], female DOB: [birth year], 44 y.o. MRN: [id number] Preoperative History and Physical  [name] [name] is a 44 y.o. No obstetric history on file. with Patient's last menstrual period was 09/22/2014. admitted for a abdominal hysterecomy(with removal of the cervix) bilateral salpingectomy and preservation of the ovaries.  Pt with increasing pelvic pain 1 year or so, increasing in frequency and intensity Achy now sharp affects back and hips Was told some years ago she had fibroids unsure of size +dyspareunia Periods are regularly irregular  See sonogram report below  PMH:  Past Medical History  Diagnosis Date  . Scoliosis     PSH: History reviewed. No pertinent past surgical history.  POb/GynH:  OB History    No data available      SH:  Social History  Substance Use Topics  . Smoking status: Current Every Day Smoker -- 1.00 packs/day    Types: Cigarettes  . Smokeless tobacco: Current User  . Alcohol Use: None    FH:  Family History  Problem Relation Age of Onset  . Hypertension Mother   . Other Mother     lumpectomy  . Heart disease Mother   . Other Sister     lumpectomy  . Heart disease Sister      Allergies:  Allergies  Allergen Reactions  . Meperidine Hcl     Medications:  Current outpatient prescriptions:  . ibuprofen (ADVIL,MOTRIN) 800 MG tablet, Take 800 mg by mouth every 8 (eight) hours as needed., Disp: , Rfl:  . traMADol (ULTRAM) 50 MG tablet, Take 1 tablet (50 mg total) by mouth every 6 (six) hours as needed., Disp: 30 tablet, Rfl: 0  Review of Systems:   Review of Systems  Constitutional: Negative for fever, chills, weight loss, malaise/fatigue and diaphoresis.  HENT: Negative for hearing loss, ear pain, nosebleeds, congestion, sore throat, neck pain, tinnitus and ear discharge.  Eyes: Negative for blurred vision, double vision,  photophobia, pain, discharge and redness.  Respiratory: Negative for cough, hemoptysis, sputum production, shortness of breath, wheezing and stridor.  Cardiovascular: Negative for chest pain, palpitations, orthopnea, claudication, leg swelling and PND.  Gastrointestinal: Positive for abdominal pain. Negative for heartburn, nausea, vomiting, diarrhea, constipation, blood in stool and melena.  Genitourinary: Negative for dysuria, urgency, frequency, hematuria and flank pain.  Musculoskeletal: Negative for myalgias, back pain, joint pain and falls.  Skin: Negative for itching and rash.  Neurological: Negative for dizziness, tingling, tremors, sensory change, speech change, focal weakness, seizures, loss of consciousness, weakness and headaches.  Endo/Heme/Allergies: Negative for environmental allergies and polydipsia. Does not bruise/bleed easily.  Psychiatric/Behavioral: Negative for depression, suicidal ideas, hallucinations, memory loss and substance abuse. The patient is not nervous/anxious and does not have insomnia.     PHYSICAL EXAM:  Blood pressure 118/60, pulse 76, weight 132 lb 6.4 oz (60.056 kg), last menstrual period 09/22/2014.   Vitals reviewed. Constitutional: She is oriented to person, place, and time. She appears well-developed and well-nourished.  HENT:  Head: Normocephalic and atraumatic.  Right Ear: External ear normal.  Left Ear: External ear normal.  Nose: Nose normal.  Mouth/Throat: Oropharynx is clear and moist.  Eyes: Conjunctivae and EOM are normal. Pupils are equal, round, and reactive to light. Right eye exhibits no discharge. Left eye exhibits no discharge. No scleral icterus.  Neck: Normal range of motion. Neck supple. No tracheal deviation present. No thyromegaly present.  Cardiovascular: Normal rate, regular rhythm, normal heart sounds and intact distal pulses. Exam reveals no gallop  and no friction rub.  No murmur heard. Respiratory: Effort normal  and breath sounds normal. No respiratory distress. She has no wheezes. She has no rales. She exhibits no tenderness.  GI: Soft. Bowel sounds are normal. She exhibits no distension and no mass. There is tenderness. There is no rebound and no guarding.  Genitourinary:   Vulva is normal without lesions Vagina is pink moist without discharge Cervix normal in appearance and pap is normal Uterus is 10 weeks size filling pelvis side to side Adnexa is negative with normal sized ovaries by sonogram  Musculoskeletal: Normal range of motion. She exhibits no edema and no tenderness.  Neurological: She is alert and oriented to person, place, and time. She has normal reflexes. She displays normal reflexes. No cranial nerve deficit. She exhibits normal muscle tone. Coordination normal.  Skin: Skin is warm and dry. No rash noted. No erythema. No pallor.  Psychiatric: She has a normal mood and affect. Her behavior is normal. Judgment and thought content normal.    Labs: Results for orders placed or performed in visit on 12/25/14 (from the past 336 hour(s))  Cytology - PAP   Collection Time: 12/25/14 12:00 AM  Result Value Ref Range   CYTOLOGY - PAP PAP RESULT negative cytology(LHE reviewed)    EKG: No orders found for this or any previous visit.  Imaging Studies:  Imaging Results    US Transvaginal Non-ob  01/01/2015 GYNECOLOGIC SONOGRAM [name] [name] is a 44 y.o.Marland KitchenLMP 09/22/2014 for a pelvic sonogram for pelvic mass. Uterus 9.6 x 5.3 x 5.8 cm, heterogenous retroverted uterus w/ mult fibroid Endometrium 8.5 mm, symmetrical wnl Right ovary 2.9 x 3.1 x 2.9 cm, wnl Left ovary 3.5 x 2.2 x 1.07 cm, wnl Technician Comments: US PELVIC TA/TV: heterogenous retroverted uterus w/ mult fibroid, largest fibroids #1) fundal pedunculated fibroid 1.8 x 1.3 x 1.7cm,(#2)post rt intramural fibroid 2.4 x 1.3 x 2.3cm,LUS intramural fibroid 2.9  x 2.3 x 3 cm,normal ov's bilat (mobile),EEC 8.60mm [name] [name] 01/01/2015 3:05 PM Clinical Impression and recommendations: I have reviewed the sonogram results above, combined with the patient's current clinical course, below are my impressions and any appropriate recommendations for management based on the sonographic findings. Multiple fibroids present Ovaries are normal Endometrium normal Jennifr Gaeta H 01/01/2015 3:29 PM   US Pelvis Complete  01/01/2015 GYNECOLOGIC SONOGRAM Alyene P Gwendlyn [name] is a 44 y.o.Marland KitchenLMP 09/22/2014 for a pelvic sonogram for pelvic mass. Uterus 9.6 x 5.3 x 5.8 cm, heterogenous retroverted uterus w/ mult fibroid Endometrium 8.5 mm, symmetrical wnl Right ovary 2.9 x 3.1 x 2.9 cm, wnl Left ovary 3.5 x 2.2 x 1.07 cm, wnl Technician Comments: US PELVIC TA/TV: heterogenous retroverted uterus w/ mult fibroid, largest fibroids #1) fundal pedunculated fibroid 1.8 x 1.3 x 1.7cm,(#2)post rt intramural fibroid 2.4 x 1.3 x 2.3cm,LUS intramural fibroid 2.9 x 2.3 x 3 cm,normal ov's bilat (mobile),EEC 8.40mm [name] [name] 01/01/2015 3:05 PM Clinical Impression and recommendations: I have reviewed the sonogram results above, combined with the patient's current clinical course, below are my impressions and any appropriate recommendations for management based on the sonographic findings. Multiple fibroids present Ovaries are normal Endometrium normal Neda Willenbring H 01/01/2015 3:29 PM       Assessment: Uterine leiomyoma, unspecified location  Pelvic pain in female  Dyspareunia   Plan: Plan abdominal hysterectomy and removal of both Fallopian tubes on 01/21/2015, plan is to preserve the ovaries  Pt understands the risks of surgery including but not limited t excessive bleeding requiring  transfusion or reoperation, post-operative infection requiring prolonged hospitalization or re-hospitalization and antibiotic  therapy, and damage to other organs including bladder, bowel, ureters and major vessels. The patient also understands the alternative treatment options which were discussed in full. All questions were answered.  Florian Buff, MD 01/21/2015 8:05 AM

## 2015-01-21 NOTE — Anesthesia Preprocedure Evaluation (Signed)
Anesthesia Evaluation  Patient identified by MRN, date of birth, ID band Patient awake    Reviewed: Allergy & Precautions, NPO status , Patient's Chart, lab work & pertinent test results  Airway Mallampati: II  TM Distance: >3 FB     Dental  (+) Teeth Intact, Missing   Pulmonary Current Smoker,    breath sounds clear to auscultation       Cardiovascular negative cardio ROS   Rhythm:Regular Rate:Normal     Neuro/Psych PSYCHIATRIC DISORDERS Depression    GI/Hepatic negative GI ROS,   Endo/Other    Renal/GU      Musculoskeletal   Abdominal   Peds  Hematology   Anesthesia Other Findings   Reproductive/Obstetrics                             Anesthesia Physical Anesthesia Plan  ASA: II  Anesthesia Plan: General   Post-op Pain Management:    Induction: Intravenous  Airway Management Planned: Oral ETT  Additional Equipment:   Intra-op Plan:   Post-operative Plan: Extubation in OR  Informed Consent: I have reviewed the patients History and Physical, chart, labs and discussed the procedure including the risks, benefits and alternatives for the proposed anesthesia with the patient or authorized representative who has indicated his/her understanding and acceptance.     Plan Discussed with:   Anesthesia Plan Comments:         Anesthesia Quick Evaluation

## 2015-01-22 ENCOUNTER — Encounter (HOSPITAL_COMMUNITY): Payer: Self-pay | Admitting: Obstetrics & Gynecology

## 2015-01-22 LAB — BASIC METABOLIC PANEL
Anion gap: 4 — ABNORMAL LOW (ref 5–15)
BUN: <5 mg/dL — ABNORMAL LOW (ref 6–20)
CHLORIDE: 108 mmol/L (ref 101–111)
CO2: 26 mmol/L (ref 22–32)
CREATININE: 0.52 mg/dL (ref 0.44–1.00)
Calcium: 7.8 mg/dL — ABNORMAL LOW (ref 8.9–10.3)
GFR calc Af Amer: >60 mL/min (ref >60)
GFR calc non Af Amer: >60 mL/min (ref >60)
GLUCOSE: 103 mg/dL — AB (ref 65–99)
POTASSIUM: 3.5 mmol/L (ref 3.5–5.1)
SODIUM: 138 mmol/L (ref 135–145)

## 2015-01-22 LAB — CBC
HCT: 33.3 % — ABNORMAL LOW (ref 36.0–46.0)
HEMOGLOBIN: 11.5 g/dL — AB (ref 12.0–15.0)
MCH: 31 pg (ref 26.0–34.0)
MCHC: 34.5 g/dL (ref 30.0–36.0)
MCV: 89.8 fL (ref 78.0–100.0)
Platelets: 196 10*3/uL (ref 150–400)
RBC: 3.71 MIL/uL — AB (ref 3.87–5.11)
RDW: 12.5 % (ref 11.5–15.5)
WBC: 10.4 10*3/uL (ref 4.0–10.5)

## 2015-01-22 MED ORDER — KETOROLAC TROMETHAMINE 30 MG/ML IJ SOLN
30.0000 mg | Freq: Once | INTRAMUSCULAR | Status: AC
  Administered 2015-01-22: 30 mg via INTRAVENOUS
  Filled 2015-01-22: qty 1

## 2015-01-22 MED ORDER — KETOROLAC TROMETHAMINE 10 MG PO TABS: 10.0000 mg | ORAL_TABLET | Freq: Three times a day (TID) | ORAL | Status: DC | PRN

## 2015-01-22 MED ORDER — HYDROMORPHONE HCL 2 MG PO TABS: 2.0000 mg | ORAL_TABLET | ORAL | Status: DC | PRN

## 2015-01-22 MED ORDER — KETOROLAC TROMETHAMINE 30 MG/ML IJ SOLN
30.0000 mg | Freq: Once | INTRAMUSCULAR | Status: AC
  Administered 2015-01-22: 30 mg via INTRAVENOUS

## 2015-01-22 MED ORDER — PROGESTERONE MICRONIZED 200 MG PO CAPS: 200.0000 mg | ORAL_CAPSULE | Freq: Every day | ORAL | Status: DC

## 2015-01-22 MED ORDER — ONDANSETRON HCL 8 MG PO TABS: 8.0000 mg | ORAL_TABLET | Freq: Four times a day (QID) | ORAL | Status: DC | PRN

## 2015-01-22 NOTE — Progress Notes (Signed)
Discharge instruction reviewed with patient. Prescription given to patient. Patient taken to lobby via wheelchair. 

## 2015-01-22 NOTE — Discharge Summary (Signed)
Physician Discharge Summary  Patient ID: [name] [name] MRN: [id number] DOB/AGE: November 04, 1970 44 y.o.  Admit date: 01/21/2015 Discharge date: 01/22/2015  Admission Diagnoses:  Discharge Diagnoses:  Active Problems:   S/P total hysterectomy and bilateral salpingo-oophorectomy intra operative IVP Discharged Condition: stable  Hospital Course: unremarkable  Consults: None  Significant Diagnostic Studies:   Treatments: surgery: TAHBSO  Discharge Exam: Blood pressure 112/53, pulse 96, temperature 98.5 F (36.9 C), temperature source Oral, resp. rate 19, height 5\' 2"  (1.575 m), weight 133 lb (60.328 kg), last menstrual period 09/22/2014, SpO2 100 %. General appearance: alert, cooperative and mild pain GI: soft, non-tender; bowel sounds normal; no masses,  no organomegaly Incision/Wound:clean dry intact  Disposition: Final discharge disposition not confirmed  Discharge Instructions    Diet - low sodium heart healthy    Complete by:  As directed      Driving Restrictions    Complete by:  As directed   No driving for 1 week     Increase activity slowly    Complete by:  As directed      Leave dressing on - Keep it clean, dry, and intact until clinic visit    Complete by:  As directed      Lifting restrictions    Complete by:  As directed   Do not lift more than 8 pounds     Sexual Activity Restrictions    Complete by:  As directed   No sex for(ever) 6 weeks            Medication List    STOP taking these medications        oxyCODONE-acetaminophen 5-325 MG tablet  Commonly known as:  PERCOCET/ROXICET     traMADol 50 MG tablet  Commonly known as:  ULTRAM      TAKE these medications        HYDROmorphone 2 MG tablet  Commonly known as:  DILAUDID  Take 1-2 tablets (2-4 mg total) by mouth every 4 (four) hours as needed for severe pain.     ketorolac 10 MG tablet  Commonly known as:  TORADOL  Take 1 tablet (10 mg total) by mouth every 8 (eight) hours as needed.     ketorolac 10 MG tablet  Commonly known as:  TORADOL  Take 1 tablet (10 mg total) by mouth every 8 (eight) hours as needed.     ondansetron 8 MG tablet  Commonly known as:  ZOFRAN  Take 1 tablet (8 mg total) by mouth every 6 (six) hours as needed for nausea.     progesterone 200 MG capsule  Commonly known as:  PROMETRIUM  Take 1 capsule (200 mg total) by mouth daily. At bedtime           Follow-up Information    Follow up with Florian Buff, MD On 01/29/2015.   Specialties:  Obstetrics and Gynecology, Radiology   Why:  post op visit, For wound re-check   Contact information:   [place] [address] [phone]       Signed: Florian Buff 01/22/2015, 12:54 PM

## 2015-01-22 NOTE — Care Management Note (Signed)
Case Management Note  Patient Details  Name: [name] [name] MRN: [id number] Date of Birth: [birth year]  Subjective/Objective:                  Pt admitted from home s/p hysterectomy. Pt lives with her husband and will return home at discharge. Pt is independent with ADl's.  Action/Plan: Pt for discharge home today. No CM needs noted.  Expected Discharge Date:                  Expected Discharge Plan:  Home/Self Care  In-House Referral:  NA  Discharge planning Services  CM Consult  Post Acute Care Choice:  NA Choice offered to:  NA  DME Arranged:    DME Agency:     HH Arranged:    HH Agency:     Status of Service:  Completed, signed off  Medicare Important Message Given:    Date Medicare IM Given:    Medicare IM give by:    Date Additional Medicare IM Given:    Additional Medicare Important Message give by:     If discussed at [place] of Stay Meetings, dates discussed:    Additional Comments:  Joylene Draft, RN 01/22/2015, 1:13 PM

## 2015-01-22 NOTE — Discharge Instructions (Signed)
Abdominal Hysterectomy, Care After Refer to this sheet in the next few weeks. These instructions provide you with information on caring for yourself after your procedure. Your health care provider may also give you more specific instructions. Your treatment has been planned according to current medical practices, but problems sometimes occur. Call your health care provider if you have any problems or questions after your procedure.  WHAT TO EXPECT AFTER THE PROCEDURE After your procedure, it is typical to have the following:  Pain.  Feeling tired.  Poor appetite.  Less interest in sex. HOME CARE INSTRUCTIONS  It takes 4-6 weeks to recover from this surgery. Make sure you follow all your health care provider's instructions. Home care instructions may include:  Take pain medicines only as directed by your health care provider. Do not take over-the-counter pain medicines without checking with your health care provider first.  Change your bandage as directed by your health care provider.  Return to your health care provider to have your sutures taken out.  Take showers instead of baths for 2-3 weeks. Ask your health care provider when it is safe to start showering.  Do not douche, use tampons, or have sexual intercourse for at least 6 weeks or until your health care provider says you can.   Follow your health care provider's advice about exercise, lifting, driving, and general activities.  Get plenty of rest and sleep.   Do not lift anything heavier than a gallon of milk (about 10 lb [4.5 kg]) for the first month after surgery.  You can resume your normal diet if your health care provider says it is okay.   Do not drink alcohol until your health care provider says you can.   If you are constipated, ask your health care provider if you can take a mild laxative.  Eating foods high in fiber may also help with constipation. Eat plenty of raw fruits and vegetables, whole grains, and  beans.  Drink enough fluids to keep your urine clear or pale yellow.   Try to have someone at home with you for the first 1-2 weeks to help around the house.  Keep all follow-up appointments. SEEK MEDICAL CARE IF:   You have chills or fever.  You have swelling, redness, or pain in the area of your incision that is getting worse.   You have pus coming from the incision.   You notice a bad smell coming from the incision or bandage.   Your incision breaks open.   You feel dizzy or light-headed.   You have pain or bleeding when you urinate.   You have persistent diarrhea.   You have persistent nausea and vomiting.   You have abnormal vaginal discharge.   You have a rash.   You have any type of abnormal reaction or develop an allergy to your medicine.   Your pain medicine is not helping.  SEEK IMMEDIATE MEDICAL CARE IF:   You have a fever and your symptoms suddenly get worse.  You have severe abdominal pain.  You have chest pain.  You have shortness of breath.  You faint.  You have pain, swelling, or redness of your leg.  You have heavy vaginal bleeding with blood clots. MAKE SURE YOU:  Understand these instructions.  Will watch your condition.  Will get help right away if you are not doing well or get worse. Document Released: 10/29/2004 Document Revised: 04/16/2013 Document Reviewed: 02/01/2013 Exit[employer] Patient Information 2015 [employer]. This information is not intended   to replace advice given to you by your health care provider. Make sure you discuss any questions you have with your health care provider. ° °

## 2015-01-25 LAB — TYPE AND SCREEN
ABO/RH(D): AB NEG
ANTIBODY SCREEN: NEGATIVE

## 2015-01-30 ENCOUNTER — Encounter: Payer: Self-pay | Admitting: Obstetrics & Gynecology

## 2015-01-30 ENCOUNTER — Ambulatory Visit (INDEPENDENT_AMBULATORY_CARE_PROVIDER_SITE_OTHER): Payer: Self-pay | Admitting: Obstetrics & Gynecology

## 2015-01-30 VITALS — BP 110/90 | HR 76 | Wt 128.0 lb

## 2015-01-30 DIAGNOSIS — Z9889 Other specified postprocedural states: Secondary | ICD-10-CM

## 2015-01-30 DIAGNOSIS — Z90722 Acquired absence of ovaries, bilateral: Principal | ICD-10-CM

## 2015-01-30 DIAGNOSIS — Z90710 Acquired absence of both cervix and uterus: Secondary | ICD-10-CM

## 2015-01-30 DIAGNOSIS — Z9079 Acquired absence of other genital organ(s): Principal | ICD-10-CM

## 2015-01-30 MED ORDER — KETOROLAC TROMETHAMINE 10 MG PO TABS: 10.0000 mg | ORAL_TABLET | Freq: Three times a day (TID) | ORAL | Status: DC | PRN

## 2015-01-30 MED ORDER — HYDROMORPHONE HCL 2 MG PO TABS: 2.0000 mg | ORAL_TABLET | ORAL | Status: DC | PRN

## 2015-01-30 NOTE — Progress Notes (Signed)
Patient ID: [name] [name], female   DOB: August 22, 1970, 44 y.o.   MRN: [id number]  HPI: Patient returns for routine postoperative follow-up having undergone hysterectomy 0n 9/28.  The patient's immediate postoperative recovery has been unremarkable. Since hospital discharge the patient reports incisional pain, cough cold symptoms.   Current Outpatient Prescriptions: HYDROmorphone (DILAUDID) 2 MG tablet, Take 1-2 tablets (2-4 mg total) by mouth every 4 (four) hours as needed for severe pain., Disp: 30 tablet, Rfl: 0 ketorolac (TORADOL) 10 MG tablet, Take 1 tablet (10 mg total) by mouth every 8 (eight) hours as needed., Disp: 15 tablet, Rfl: 0 progesterone (PROMETRIUM) 200 MG capsule, Take 1 capsule (200 mg total) by mouth daily. At bedtime, Disp: 30 capsule, Rfl: 11 ketorolac (TORADOL) 10 MG tablet, Take 1 tablet (10 mg total) by mouth every 8 (eight) hours as needed. (Patient not taking: Reported on 01/30/2015), Disp: 15 tablet, Rfl: 0 ondansetron (ZOFRAN) 8 MG tablet, Take 1 tablet (8 mg total) by mouth every 6 (six) hours as needed for nausea. (Patient not taking: Reported on 01/30/2015), Disp: 20 tablet, Rfl: 0  No current facility-administered medications for this visit.    Blood pressure 110/90, pulse 76, weight 128 lb (58.06 kg), last menstrual period 09/22/2014.  Physical Exam: Incision clean dry intact  Diagnostic Tests: none  Pathology: Benign, endometriosis  Impression: S/p TAHBSO  Plan:   Follow up: 4  weeks  Florian Buff, MD

## 2015-02-02 ENCOUNTER — Encounter: Payer: Self-pay | Admitting: Obstetrics & Gynecology

## 2015-02-06 ENCOUNTER — Telehealth: Payer: Self-pay | Admitting: Obstetrics & Gynecology

## 2015-02-06 MED ORDER — OXYCODONE-ACETAMINOPHEN 5-325 MG PO TABS: 1.0000 | ORAL_TABLET | ORAL | Status: DC | PRN

## 2015-02-06 NOTE — Telephone Encounter (Signed)
Pt states had surgery on 01/21/2015 Hysterectomy abdominal with IVP, taking Diluaded for pain requesting pain med that is not as strong as the Diluaded. Also, pt states has cold been coughing a lot incision site "feels like fire to a point she has been in tears." Please advise.

## 2015-02-06 NOTE — Telephone Encounter (Signed)
Pt informed Percocet RX left at front desk for pick up. Pt informed to take one or the other not to take both the Percocet and Dilaudid. Pt states there is no drainage, redness, or swelling at incision site "just burns like fire with coughing." Pt informed to use a pillow for abdominal support. Pt to call our office back if any changes or no improvement. Pt verbalized understanding.

## 2015-02-12 ENCOUNTER — Telehealth: Payer: Self-pay | Admitting: Obstetrics & Gynecology

## 2015-02-12 NOTE — Telephone Encounter (Signed)
Pt c/o "crying over anything, emotions out wack, drained and tired." Pt states taking progesterone as Dr. Elonda Husky prescribed since her hysterectomy on 01/21/2015. Pt states has had a cold for 2 weeks. Please advise.

## 2015-02-13 ENCOUNTER — Telehealth: Payer: Self-pay | Admitting: *Deleted

## 2015-02-13 ENCOUNTER — Telehealth: Payer: Self-pay | Admitting: Obstetrics & Gynecology

## 2015-02-13 MED ORDER — ESCITALOPRAM OXALATE 10 MG PO TABS: 10.0000 mg | ORAL_TABLET | Freq: Every day | ORAL | Status: DC

## 2015-02-13 NOTE — Telephone Encounter (Signed)
Pt informed per Dr. Elonda Husky will prescribe Lexapro. Pt to f/u with her pharmacy. Pt verbalized understanding.

## 2015-02-13 NOTE — Telephone Encounter (Signed)
Pt informed Lexapro prescribed to pharmacy and to continue Prometrium but not to take any estrogen until after 3 months from Hysterectomy. Pt verbalized understanding.

## 2015-02-27 ENCOUNTER — Ambulatory Visit (INDEPENDENT_AMBULATORY_CARE_PROVIDER_SITE_OTHER): Payer: Self-pay | Admitting: Obstetrics & Gynecology

## 2015-02-27 ENCOUNTER — Encounter: Payer: Self-pay | Admitting: Obstetrics & Gynecology

## 2015-02-27 VITALS — BP 110/80 | HR 92 | Ht 62.0 in | Wt 131.3 lb

## 2015-02-27 DIAGNOSIS — B9689 Other specified bacterial agents as the cause of diseases classified elsewhere: Secondary | ICD-10-CM

## 2015-02-27 DIAGNOSIS — Z90710 Acquired absence of both cervix and uterus: Secondary | ICD-10-CM

## 2015-02-27 DIAGNOSIS — Z90722 Acquired absence of ovaries, bilateral: Principal | ICD-10-CM

## 2015-02-27 DIAGNOSIS — Z9079 Acquired absence of other genital organ(s): Secondary | ICD-10-CM

## 2015-02-27 DIAGNOSIS — N760 Acute vaginitis: Secondary | ICD-10-CM

## 2015-02-27 DIAGNOSIS — A499 Bacterial infection, unspecified: Secondary | ICD-10-CM

## 2015-02-27 MED ORDER — ESTRADIOL 2 MG PO TABS: 2.0000 mg | ORAL_TABLET | Freq: Every day | ORAL | Status: DC

## 2015-02-27 MED ORDER — METRONIDAZOLE 500 MG PO TABS: 500.0000 mg | ORAL_TABLET | Freq: Two times a day (BID) | ORAL | Status: DC

## 2015-02-27 NOTE — Progress Notes (Signed)
Patient ID: [name] [name], female   DOB: 06-20-1970, 44 y.o.   MRN: [id number]  HPI: Patient returns for routine postoperative follow-up having undergone TAHBSO on 01/21/2015.  The patient's immediate postoperative recovery has been unremarkable. Since hospital discharge the patient reports emotional lability better on lexapro Pt was not begun on estrogen right away due to endometriosis found at the time of surgery.   Current Outpatient Prescriptions: escitalopram (LEXAPRO) 10 MG tablet, Take 1 tablet (10 mg total) by mouth daily., Disp: 30 tablet, Rfl: 11 progesterone (PROMETRIUM) 200 MG capsule, Take 1 capsule (200 mg total) by mouth daily. At bedtime, Disp: 30 capsule, Rfl: 11 estradiol (ESTRACE) 2 MG tablet, Take 1 tablet (2 mg total) by mouth daily., Disp: 30 tablet, Rfl: 11 metroNIDAZOLE (FLAGYL) 500 MG tablet, Take 1 tablet (500 mg total) by mouth 2 (two) times daily., Disp: 14 tablet, Rfl: 0  No current facility-administered medications for this visit.    Blood pressure 110/80, pulse 92, height 5\' 2"  (1.575 m), weight 131 lb 4.8 oz (59.557 kg), last menstrual period 09/22/2014.  Physical Exam: Incision clean dry intact, the left corner suture knot of the subcuticular Vagina post op BV is present, sutures are still intact No midline or adnexal masses, everything is normal  Diagnostic Tests:   Pathology: benign  Impression: S/p tahbso with endometriosis BV  Plan: Meds ordered this encounter  Medications  . metroNIDAZOLE (FLAGYL) 500 MG tablet    Sig: Take 1 tablet (500 mg total) by mouth 2 (two) times daily.    Dispense:  14 tablet    Refill:  0  . estradiol (ESTRACE) 2 MG tablet    Sig: Take 1 tablet (2 mg total) by mouth daily.    Dispense:  30 tablet    Refill:  11    This prescription is to be filled and begin after April 26, 2015     Follow up: 1  years  Florian Buff, MD

## 2015-05-26 ENCOUNTER — Emergency Department (HOSPITAL_COMMUNITY): Payer: Self-pay

## 2015-05-26 ENCOUNTER — Emergency Department (HOSPITAL_COMMUNITY)
Admission: EM | Admit: 2015-05-26 | Discharge: 2015-05-26 | Disposition: A | Discharge status: Home / Self Care | Payer: Self-pay | Attending: Emergency Medicine | Admitting: Emergency Medicine

## 2015-05-26 ENCOUNTER — Encounter (HOSPITAL_COMMUNITY): Payer: Self-pay | Admitting: Emergency Medicine

## 2015-05-26 DIAGNOSIS — Y9289 Other specified places as the place of occurrence of the external cause: Secondary | ICD-10-CM | POA: Insufficient documentation

## 2015-05-26 DIAGNOSIS — S39012A Strain of muscle, fascia and tendon of lower back, initial encounter: Secondary | ICD-10-CM | POA: Insufficient documentation

## 2015-05-26 DIAGNOSIS — S73102A Unspecified sprain of left hip, initial encounter: Secondary | ICD-10-CM | POA: Insufficient documentation

## 2015-05-26 DIAGNOSIS — S0990XA Unspecified injury of head, initial encounter: Secondary | ICD-10-CM | POA: Insufficient documentation

## 2015-05-26 DIAGNOSIS — M419 Scoliosis, unspecified: Secondary | ICD-10-CM | POA: Insufficient documentation

## 2015-05-26 DIAGNOSIS — Z79899 Other long term (current) drug therapy: Secondary | ICD-10-CM | POA: Insufficient documentation

## 2015-05-26 DIAGNOSIS — Z793 Long term (current) use of hormonal contraceptives: Secondary | ICD-10-CM | POA: Insufficient documentation

## 2015-05-26 DIAGNOSIS — F17210 Nicotine dependence, cigarettes, uncomplicated: Secondary | ICD-10-CM | POA: Insufficient documentation

## 2015-05-26 DIAGNOSIS — Y998 Other external cause status: Secondary | ICD-10-CM | POA: Insufficient documentation

## 2015-05-26 DIAGNOSIS — Y9389 Activity, other specified: Secondary | ICD-10-CM | POA: Insufficient documentation

## 2015-05-26 DIAGNOSIS — Z8742 Personal history of other diseases of the female genital tract: Secondary | ICD-10-CM | POA: Insufficient documentation

## 2015-05-26 DIAGNOSIS — Z86018 Personal history of other benign neoplasm: Secondary | ICD-10-CM | POA: Insufficient documentation

## 2015-05-26 DIAGNOSIS — S6991XA Unspecified injury of right wrist, hand and finger(s), initial encounter: Secondary | ICD-10-CM | POA: Insufficient documentation

## 2015-05-26 HISTORY — DX: Other intervertebral disc degeneration, lumbar region without mention of lumbar back pain or lower extremity pain: M51.369

## 2015-05-26 HISTORY — DX: Endometriosis, unspecified: N80.9

## 2015-05-26 HISTORY — DX: Other intervertebral disc degeneration, lumbar region: M51.36

## 2015-05-26 MED ORDER — HYDROCODONE-ACETAMINOPHEN 5-325 MG PO TABS: 1.0000 | ORAL_TABLET | ORAL | Status: DC | PRN

## 2015-05-26 MED ORDER — OXYCODONE-ACETAMINOPHEN 5-325 MG PO TABS
1.0000 | ORAL_TABLET | Freq: Once | ORAL | Status: AC
  Administered 2015-05-26: 1 via ORAL
  Filled 2015-05-26: qty 1

## 2015-05-26 NOTE — Discharge Instructions (Signed)
°Emergency Department Resource Guide °1) Find a Doctor and Pay Out of Pocket °Although you won't have to find out who is covered by your insurance plan, it is a good idea to ask around and get recommendations. You will then need to call the office and see if the doctor you have chosen will accept you as a new patient and what types of options they offer for patients who are self-pay. Some doctors offer discounts or will set up payment plans for their patients who do not have insurance, but you will need to ask so you aren't surprised when you get to your appointment. ° °2) Contact Your Local Health Department °Not all health departments have doctors that can see patients for sick visits, but many do, so it is worth a call to see if yours does. If you don't know where your local health department is, you can check in your phone book. The CDC also has a tool to help you locate your state's health department, and many state websites also have listings of all of their local health departments. ° °3) Find a Walk-in Clinic °If your illness is not likely to be very severe or complicated, you may want to try a walk in clinic. These are popping up all over the country in pharmacies, drugstores, and shopping centers. They're usually staffed by nurse practitioners or physician assistants that have been trained to treat common illnesses and complaints. They're usually fairly quick and inexpensive. However, if you have serious medical issues or chronic medical problems, these are probably not your best option. ° °No Primary Care Doctor: °- Call [facility] at  832-8000 - they can help you locate a primary care doctor that  accepts your insurance, provides certain services, etc. °- Physician Referral Service- 1-800-533-3463 ° °Chronic Pain Problems: °Organization         Address  Phone   Notes  °[facility]  (336) [phone] Patients need to be referred by their primary care doctor.  ° °Medication  Assistance: °Organization         Address  Phone   Notes  °[place] [facility] [address] °[place], [place] [address] (336) 641-8030 --Must be a resident of [place] °-- Must have NO insurance coverage whatsoever (no Medicaid/ Medicare, etc.) °-- The pt. MUST have a primary care doctor that directs their care regularly and follows them in the community °  °[facility]  (866) 331-1348   °[employer]  (888) 892-1162   ° °Agencies that provide inexpensive medical care: °Organization         Address  Phone   Notes  °[facility]  (336) 832-8035   °[facility]    (336) 832-7272   °[facility] [address] °[place], [place] [address] (336) 832-4777   °[facility] [address], °[place] (336) 271-4999   °[facility]    (336) 373-0678   °[facility]    (336) 272-1050   °[facility] ° [address], [place] Phone:  (336) 832-4444, Fax:  (336) 832-4440 Hours of Operation:  9 am - 6 pm, M-F.  Also accepts Medicaid/Medicare and self-pay.  °[facility] ° [address], [place] Phone: (336) [phone], Fax: (336) [phone]. Hours of Operation:  8:30 am - 5:30 pm, M-F.  Also accepts Medicaid and self-pay.  °[facility] [place] [address], [place] Phone: (336) 878-6027   °[facility] [address], [place], [place] (336)723-1848, Ext. 123 Mondays & Thursdays: 7-9 AM.  First 15 patients are seen on a first come, first serve basis. °  ° °Medicaid-accepting [place] Providers: ° °Organization         Address  Phone   Notes  °[facility] [address], [place] (336) 641-2100 Also accepts self-pay patients.  °[facility] [address], [place] ° (336) 856-9996   °[facility] [address], [place]  (336) 288-8857   °[facility] [address] [place] [address], [place] (336) 299-7000   °Veita Bland [address], [place]  ° (336) 373-1557 Only accepts [place] Access Medicaid patients after they have their name applied to their card.  ° °Self-Pay (no insurance) in [place]: ° °Organization         Address  Phone   Notes  °Sickle Cell Patients, [facility] [address], [place] (336) 832-1970   °[facility] [address], [place] (336) 832-4400   °[facility] ° [address], [place] (336) 992-4800   °[facility]/Dr. Osei-Bonsu ° [address] [place] [address], [place] or [address], [place] (336) 841-8500 Phone number for both [place] and [place] locations is the same.  °[facility] [address], [place] (336) 299-0000   °[facility] [address] [place] [address], [place] or [address] (336) 852-7530 °(336) 878-2260   °[facility] [address], [place] (336) 350-1642, phone; (336) 294-5005, fax Sees patients 1st and 3rd Saturday of every month.  Must not qualify for public or private insurance (i.e. Medicaid, Medicare, [place] Health Choice, Veterans' Benefits) • Household income should be no more than 200% of the poverty level •The clinic cannot treat you if you are pregnant or think you are pregnant • Sexually transmitted diseases are not treated at the clinic.  ° ° °Dental Care: °Organization         Address  Phone  Notes  °[place] [facility] [address],  (336) 641-6152 Accepts children up to age 21 who are enrolled in Medicaid or [place] Health Choice; pregnant women with a Medicaid card; and children who have applied for Medicaid or [place] Health Choice, but were declined, whose parents can pay a reduced fee at time of service.  °[place]  [facility] [place]  [address], [place] (336) 641-7733 Accepts children up to age 21 who are enrolled in Medicaid or [place] Health Choice; pregnant women with a Medicaid card; and children who have applied for Medicaid or [place] Health Choice, but were declined, whose parents can pay a reduced fee at time of service.  °[facility] ° [address],  (336) 641-4533 Patients are seen by appointment only. Walk-ins are not accepted. [facility] will see patients 18 years of age and older. °Monday - Tuesday (8am-5pm) °Most Wednesdays (8:30-5pm) °$30 per visit, cash only  °[facility] ° [address], [place] (336) 641-4533 Patients are seen by appointment only. Walk-ins are not accepted. [facility] will see patients 18 years of age and older. °One   Wednesday Evening (Monthly: Volunteer Based).  $30 per visit, cash only  °[facility]  (919) 537-3737 for adults; Children under age 4, call Graduate Pediatric Dentistry at (919) [phone]. Children aged 4-14, please call (919) [phone] to request a pediatric application. ° Dental services are provided in all areas of dental care including fillings, crowns and bridges, complete and partial dentures, implants, gum treatment, root canals, and extractions. Preventive care is also provided. Treatment is provided to both adults and children. °Patients are selected via a lottery and there is often a waiting list. °  °[facility] [address], °[place] ° (336) 763-8833 [email or web address] °  °[facility] [address], [place], [place] (336)723-1848, Ext. 123 Second and Fourth Thursday of each month, opens at 6:30 AM; Clinic ends at 9 AM.  Patients are seen on a first-come first-served basis, and a limited number are seen during each clinic.  ° °[facility] ° [address], [place], [place] (336) 723-7904    Eligibility Requirements °You must have lived in [place], [place], or [place] counties for at least the last three months. °  You cannot be eligible for state or federal sponsored healthcare insurance, including Veterans Administration, Medicaid, or Medicare. °  You generally cannot be eligible for healthcare insurance through your employer.  °  How to apply: °Eligibility screenings are held every Tuesday and Wednesday afternoon from 1:00 pm until 4:00 pm. You do not need an appointment for the interview!  °[facility] [address], [place], [place] 336-631-2330   °[facility]  336-342-8273   °[facility]  336-703-3100   °[facility]  336-570-6415   ° °Behavioral Health Resources in the Community: °Intensive Outpatient Programs °Organization         Address  Phone  Notes  °[facility] [address], [place], [place] 336-878-6098   °[facility] [address], [place], [place] 336-832-9800   °[facility] [address], [place], [place] ° 336-882-2125   °[facility] [address],  °[place], [place] 1-800-853-5163 or 336-641-4981   °Substance Abuse Resources °Organization         Address  Phone  Notes  °[facility]  336-882-2125   °[facility]  336-784-9470   °[facility]  336-285-9073   °[facility]  336-845-3988   °Residential & Outpatient Substance Abuse Program  1-800-659-3381   °Psychological Services °Organization         Address  Phone  Notes  °[facility]  336- 832-9600   °[facility]  336- 378-7881   °[facility] [address], [place] 1-800-853-5163 or 336-641-4981   ° °Mobile Crisis Teams °Organization         Address  Phone  Notes  °[facility], Mobile Crisis Care Unit  1-877-626-1772   °Assertive °[facility] ° [address]  [place], [place] 336-834-9664   °Sharon DeEsch [address] °[place] 336-554-5454   ° °Self-Help/Support Groups °Organization         Address  Phone             Notes  °[facility] [place] - variety of support groups  336- 373-1402 Call for more information  °Narcotics Anonymous (NA), [facility] [address], °[place]  2 meetings at this location  ° °  Residential Treatment Programs Organization         Address  Phone  Notes  [facility] [address],    [place]  1-364 534 2294   [facility]  [address], [place] [id number], [place], [place]   [place], [place] 249-084-0713 Admissions: 8am-3pm M-F  [facility] [place] [address]    [place], [place] X4321937   [facility] [address] [place], [place], [place]   [facility] [address],  [place], [place]   [facility] - Intensive Outpatient [address], [place], [place]   [facility] [place]  [place], [place] 1-(862)184-6755 or 236-874-6965   [facility] [address], [place], [place] Accepts Medicaid  [facility] [address]  [place] 1-704-145-5261 Substance Abuse/Addiction Treatment   [facility] Organization         Address  Phone  Notes  [facility]  [phone]   Domenic Schwab, PhD [address] [place] [place], [place]   919-103-8969 or [phone]   [facility] [place], [place] [phone]   [facility] [address] [place], [place] 660-788-7131 Insurance/Medicaid/sponsorship through [facility] [address] [place]                                    [place], [place] [phone] [place] [address], [place] [phone]    Dr. Adele Schilder  ([phone]   [facility] Dept. 1) [address] [place] 2) [place] 3)  [address], [place] [phone]  [phone]   [place] (501)[phone] or ([phone] (After Hours)      SEEK IMMEDIATE MEDICAL ATTENTION IF: New numbness, tingling, weakness, or problem with the use of your arms or legs.  Severe back pain not relieved with medications.  Change in bowel or bladder control (if you lose control of stool or urine, or if you are unable to urinate) Increasing pain in any areas of the body (such as chest or abdominal pain).  Shortness of breath, dizziness or fainting.  Nausea (feeling sick to your stomach), vomiting, fever, or sweats.   You have had a head injury which does not appear to require admission at this time. A concussion is a state of changed mental ability from trauma.  SEEK IMMEDIATE MEDICAL ATTENTION IF: There is confusion or drowsiness (although children frequently become drowsy after injury).  You cannot awaken the injured person.  There is nausea (feeling sick to your stomach) or continued, forceful vomiting.  You notice dizziness or unsteadiness which is getting worse, or inability to walk.  You have convulsions or unconsciousness.  You experience severe, persistent headaches not relieved by Tylenol. (Do not take aspirin as this impairs clotting abilities). Take other pain medications only as directed.  You cannot use arms or legs normally.  There are changes in pupil sizes. (This is the black center in the colored part of the eye)  There is clear or bloody discharge from the nose or  ears.  Change in speech, vision, swallowing, or understanding.  Localized weakness, numbness, tingling, or change in bowel or bladder control.
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
## 2016-01-18 ENCOUNTER — Encounter (HOSPITAL_COMMUNITY): Payer: Self-pay | Admitting: Emergency Medicine

## 2016-01-18 ENCOUNTER — Emergency Department (HOSPITAL_COMMUNITY)
Admission: EM | Admit: 2016-01-18 | Discharge: 2016-01-18 | Disposition: A | Discharge status: Home / Self Care | Payer: Self-pay | Attending: Emergency Medicine | Admitting: Emergency Medicine

## 2016-01-18 DIAGNOSIS — M5442 Lumbago with sciatica, left side: Secondary | ICD-10-CM | POA: Insufficient documentation

## 2016-01-18 DIAGNOSIS — Z79899 Other long term (current) drug therapy: Secondary | ICD-10-CM | POA: Insufficient documentation

## 2016-01-18 DIAGNOSIS — M5432 Sciatica, left side: Secondary | ICD-10-CM

## 2016-01-18 DIAGNOSIS — F17210 Nicotine dependence, cigarettes, uncomplicated: Secondary | ICD-10-CM | POA: Insufficient documentation

## 2016-01-18 MED ORDER — CYCLOBENZAPRINE HCL 10 MG PO TABS: 10.0000 mg | ORAL_TABLET | Freq: Three times a day (TID) | ORAL | 0 refills | Status: DC | PRN

## 2016-01-18 MED ORDER — CYCLOBENZAPRINE HCL 10 MG PO TABS
10.0000 mg | ORAL_TABLET | Freq: Once | ORAL | Status: AC
  Administered 2016-01-18: 10 mg via ORAL
  Filled 2016-01-18: qty 1

## 2016-01-18 MED ORDER — HYDROCODONE-ACETAMINOPHEN 5-325 MG PO TABS
1.0000 | ORAL_TABLET | Freq: Once | ORAL | Status: AC
  Administered 2016-01-18: 1 via ORAL
  Filled 2016-01-18: qty 1

## 2016-01-18 MED ORDER — PREDNISONE 10 MG PO TABS: 10.0000 mg | ORAL_TABLET | Freq: Every day | ORAL | 0 refills | Status: DC

## 2016-01-18 MED ORDER — HYDROCODONE-ACETAMINOPHEN 5-325 MG PO TABS: ORAL_TABLET | ORAL | 0 refills | Status: DC

## 2016-01-18 NOTE — Discharge Instructions (Signed)
Alternate ice and heat to your back.  Follow-up with your doctor for recheck °

## 2016-01-18 NOTE — ED Provider Notes (Signed)
[facility] DEPT Provider Note   CSN: [id number] Arrival date & time: 01/18/16  [id number]  By signing my name below, I, [name] [name], attest that this documentation has been prepared under the direction and in the presence of Kem Parkinson, PA-C Electronically Signed: Soijett [name], ED Scribe. 01/18/16. 12:12 PM.   History   Chief Complaint Chief Complaint  Patient presents with  . Back Pain    HPI  [name] [name] is a 45 y.o. female with a PMHx of DDD of lumbar spine, DJD of thoracic spine, sciatica, who presents to the Emergency Department complaining of chronic lower back pain onset 5 days. Pt notes that 2 months ago she started a new job where she does a lot of heavy lifting and she thinks that  exacerbated her chronic back pain. She reports that her lower back pain does radiate to her bilateral hips, knees, and intermittently to her bilateral feet. Pt states that her lower back pain is worsened with ambulation and movement. Pt denies any alleviating factors for her chronic lower back pain. Pt denies being in a pain management clinic at this time for her chronic lower back pain. Pt states that her PCP, Dr. Wenda Overland is aware of her chronic back pain, but she was unable to go and see him due to her lack of insurance.  She states that she is having associated symptoms of bilateral hip pain, bilateral leg pain, She states that she has tried tylenol and biofreeze with no relief of her symptoms. Pt denies bowel/bladder incontinence, dysuria, numbness, tingling, weakness, and any other symptoms.     The history is provided by the patient. No language interpreter was used.  Back Pain   This is a chronic problem. Episode onset: 5 days. The problem occurs constantly. The problem has not changed since onset.The pain is associated with no known injury. The pain is present in the lumbar spine. The pain radiates to the left thigh, left knee, right thigh and right knee. The pain is moderate. The symptoms are  aggravated by bending and certain positions. The pain is the same all the time. Associated symptoms include paresthesias (bilateral legs). Pertinent negatives include no numbness, no bowel incontinence, no bladder incontinence, no tingling and no weakness. Treatments tried: tylenol and biofreeze. The treatment provided no relief.    Past Medical History:  Diagnosis Date  . Degenerative disc disease, lumbar   . 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 Endometriosis   . Fibroid uterus   . Scoliosis     Patient Active Problem List   Diagnosis Date Noted  . S/P total hysterectomy and bilateral salpingo-oophorectomy 01/21/2015  . DEPRESSION 12/15/2006    Past Surgical History:  Procedure Laterality Date  . ABDOMINAL HYSTERECTOMY N/A 01/21/2015   Procedure: HYSTERECTOMY ABDOMINAL WITH IVP;  Surgeon: Florian Buff, MD;  Location: [facility];  Service: Gynecology;  Laterality: N/A;  . SALPINGOOPHORECTOMY Bilateral 01/21/2015   Procedure: BILATERAL SALPINGO OOPHORECTOMY;  Surgeon: Florian Buff, MD;  Location: [facility];  Service: Gynecology;  Laterality: Bilateral;    OB History    No data available       Home Medications    Prior to Admission medications   Medication Sig Start Date End Date Taking? Authorizing Provider  estradiol (ESTRACE) 2 MG tablet Take 1 tablet (2 mg total) by mouth daily. 02/27/15   Florian Buff, MD  HYDROcodone-acetaminophen (NORCO/VICODIN) 5-325 MG tablet Take 1 tablet by mouth every 4 (four) hours as  needed. 05/26/15   [name], MD  progesterone (PROMETRIUM) 200 MG capsule Take 1 capsule (200 mg total) by mouth daily. At bedtime 01/22/15   Florian Buff, MD    Family History Family History  Problem Relation Age of Onset  . Hypertension Mother   . Other Mother     lumpectomy  . Heart disease Mother   . Other Sister     lumpectomy  . Heart disease Sister     Social History Social History  Substance Use Topics  . Smoking status: Current  Every Day Smoker    Packs/day: 0.50    Years: 15.00    Types: Cigarettes  . Smokeless tobacco: Current User  . Alcohol use Yes     Comment: rarely     Allergies   Ibuprofen and Meperidine hcl   Review of Systems Review of Systems  Gastrointestinal: Negative for bowel incontinence.       No bowel incontinence.   Genitourinary: Negative for bladder incontinence.       No bladder incontinence.  Musculoskeletal: Positive for arthralgias (bilateral hips and legs) and back pain. Negative for gait problem.  Skin: Negative for color change, rash and wound.  Neurological: Positive for paresthesias (bilateral legs). Negative for tingling, weakness and numbness.       Paresthesia sensation to bilateral legs     Physical Exam Updated Vital Signs BP 129/74 (BP Location: Left Arm)   Pulse 111   Temp 99 F (37.2 C) (Oral)   Resp 18   Ht 5\' 2"  (1.575 m)   Wt 135 lb (61.2 kg)   LMP 09/22/2014   SpO2 99%   BMI 24.69 kg/m   Physical Exam  Constitutional: She is oriented to person, place, and time. She appears well-developed and well-nourished. No distress.  HENT:  Head: Normocephalic and atraumatic.  Eyes: EOM are normal.  Neck: Neck supple.  Cardiovascular: Normal rate, regular rhythm and intact distal pulses.  Exam reveals no gallop and no friction rub.   No murmur heard. Pulmonary/Chest: Effort normal and breath sounds normal. No respiratory distress. She has no wheezes. She has no rales.  Abdominal: Soft. She exhibits no distension and no mass. There is no tenderness. There is no guarding.  Musculoskeletal: Normal range of motion.  Positive SLR bilaterally at 30 degrees. Equal strength against resistance of LEs. Sensation intact.   Neurological: She is alert and oriented to person, place, and time. She has normal strength. No sensory deficit.  Skin: Skin is warm and dry.  Psychiatric: She has a normal mood and affect. Her behavior is normal.  Nursing note and vitals  reviewed.    ED Treatments / Results  DIAGNOSTIC STUDIES: Oxygen Saturation is 99% on RA, nl by my interpretation.    COORDINATION OF CARE: 12:07 PM Discussed treatment plan with pt at bedside which includes flexeril, norco, prednisone Rx, norco Rx, flexeril Rx, and pt agreed to plan.   Procedures Procedures (including critical care time)  Medications Ordered in ED Medications - No data to display   Initial Impression / Assessment and Plan / ED Course  I have reviewed the triage vital signs and the nursing notes.   Clinical Course     Pt was looked up on [facility] with no records on file. Patient with back pain.  No neurological deficits and normal neuro exam.  Patient is ambulatory.  No loss of bowel or bladder control.  No concern for cauda equina.  No fever, night sweats, weight loss, h/o cancer, IVDA, no recent procedure to back. No urinary symptoms suggestive of UTI. Will treat the pt with flexeril and norco in the ED. Pt will be discharged home with prednisone Rx, norco Rx, and flexeril Rx. Supportive care and return precaution discussed. Appears safe for discharge at this time. Follow up as indicated in discharge paperwork.    Final Clinical Impressions(s) / ED Diagnoses   Final diagnoses:  Sciatica of left side    New Prescriptions New Prescriptions   No medications on file    I personally performed the services described in this documentation, which was scribed in my presence. The recorded information has been reviewed and is accurate.     Kem Parkinson, PA-C 01/20/16 2054    Fredia Sorrow, MD 01/23/16 1451

## 2016-01-18 NOTE — ED Triage Notes (Signed)
PT c/o lower back pain radiating down bilateral hips and legs worsening x5 days. PT also c/o pain with movement to left shoulder and pain radiating down left arm for over 3 months.

## 2016-05-05 ENCOUNTER — Emergency Department (HOSPITAL_COMMUNITY)
Admission: EM | Admit: 2016-05-05 | Discharge: 2016-05-05 | Disposition: A | Discharge status: Home / Self Care | Payer: 59 | Attending: Emergency Medicine | Admitting: Emergency Medicine

## 2016-05-05 ENCOUNTER — Encounter (HOSPITAL_COMMUNITY): Payer: Self-pay | Admitting: *Deleted

## 2016-05-05 DIAGNOSIS — F17290 Nicotine dependence, other tobacco product, uncomplicated: Secondary | ICD-10-CM | POA: Insufficient documentation

## 2016-05-05 DIAGNOSIS — M5432 Sciatica, left side: Secondary | ICD-10-CM

## 2016-05-05 DIAGNOSIS — M545 Low back pain: Secondary | ICD-10-CM | POA: Diagnosis present

## 2016-05-05 DIAGNOSIS — M5442 Lumbago with sciatica, left side: Secondary | ICD-10-CM | POA: Insufficient documentation

## 2016-05-05 DIAGNOSIS — F17210 Nicotine dependence, cigarettes, uncomplicated: Secondary | ICD-10-CM | POA: Diagnosis not present

## 2016-05-05 DIAGNOSIS — M419 Scoliosis, unspecified: Secondary | ICD-10-CM | POA: Diagnosis not present

## 2016-05-05 DIAGNOSIS — Z79899 Other long term (current) drug therapy: Secondary | ICD-10-CM | POA: Diagnosis not present

## 2016-05-05 MED ORDER — CHLORZOXAZONE 500 MG PO TABS: 500.0000 mg | ORAL_TABLET | Freq: Three times a day (TID) | ORAL | 0 refills | Status: DC

## 2016-05-05 MED ORDER — HYDROCODONE-ACETAMINOPHEN 5-325 MG PO TABS
2.0000 | ORAL_TABLET | Freq: Once | ORAL | Status: AC
  Administered 2016-05-05: 2 via ORAL
  Filled 2016-05-05: qty 2

## 2016-05-05 MED ORDER — DEXAMETHASONE 4 MG PO TABS: 4.0000 mg | ORAL_TABLET | Freq: Two times a day (BID) | ORAL | 0 refills | Status: DC

## 2016-05-05 MED ORDER — PREDNISONE 20 MG PO TABS
40.0000 mg | ORAL_TABLET | Freq: Once | ORAL | Status: AC
  Administered 2016-05-05: 40 mg via ORAL
  Filled 2016-05-05: qty 2

## 2016-05-05 MED ORDER — HYDROCODONE-ACETAMINOPHEN 5-325 MG PO TABS: 1.0000 | ORAL_TABLET | ORAL | 0 refills | Status: DC | PRN

## 2016-05-05 MED ORDER — CYCLOBENZAPRINE HCL 10 MG PO TABS
10.0000 mg | ORAL_TABLET | Freq: Once | ORAL | Status: AC
  Administered 2016-05-05: 10 mg via ORAL
  Filled 2016-05-05: qty 1

## 2016-05-05 NOTE — ED Triage Notes (Signed)
Pt comes in for lower back pain that moves into her legs bilaterally. Pt denies any injury. This is a chronic problem that has flares (last times was 3-4 months ago). Pt ambulatory

## 2016-05-05 NOTE — ED Provider Notes (Signed)
[facility] DEPT Provider Note   CSN: [id number] Arrival date & time: 05/05/16  [id number]     History   Chief Complaint Chief Complaint  Patient presents with  . Back Pain    HPI [name] [name] is a 46 y.o. female.  Pt reports hx of DDD and scoliosis. Pain worse since Monday. Pt unable to complete her task at work. Dr Wenda Overland is out of town. Pt feels she can not take the pain any more. No saddle area numbness. No frequent falls, no loss of bowel or bladder function. Pt drove herself to the ED.    Back Pain   This is a chronic problem. Episode onset: acute on chronic. The problem occurs daily. The problem has been gradually worsening. The pain is associated with no known injury. The pain is present in the lumbar spine. The quality of the pain is described as shooting and stabbing. The pain radiates to the left thigh and right thigh. The pain is moderate. The symptoms are aggravated by certain positions. The pain is the same all the time. Associated symptoms include tingling. Pertinent negatives include no chest pain, no abdominal pain, no perianal numbness, no bladder incontinence and no dysuria. She has tried nothing for the symptoms.    Past Medical History:  Diagnosis Date  . Degenerative disc disease, lumbar   . 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 Endometriosis   . Fibroid uterus   . Scoliosis     Patient Active Problem List   Diagnosis Date Noted  . S/P total hysterectomy and bilateral salpingo-oophorectomy 01/21/2015  . DEPRESSION 12/15/2006    Past Surgical History:  Procedure Laterality Date  . ABDOMINAL HYSTERECTOMY N/A 01/21/2015   Procedure: HYSTERECTOMY ABDOMINAL WITH IVP;  Surgeon: Florian Buff, MD;  Location: [facility];  Service: Gynecology;  Laterality: N/A;  . SALPINGOOPHORECTOMY Bilateral 01/21/2015   Procedure: BILATERAL SALPINGO OOPHORECTOMY;  Surgeon: Florian Buff, MD;  Location: [facility];  Service: Gynecology;  Laterality: Bilateral;    OB  History    No data available       Home Medications    Prior to Admission medications   Medication Sig Start Date End Date Taking? Authorizing Provider  cyclobenzaprine (FLEXERIL) 10 MG tablet Take 1 tablet (10 mg total) by mouth 3 (three) times daily as needed. 01/18/16   Tammy Triplett, PA-C  estradiol (ESTRACE) 2 MG tablet Take 1 tablet (2 mg total) by mouth daily. 02/27/15   Florian Buff, MD  HYDROcodone-acetaminophen (NORCO/VICODIN) 5-325 MG tablet Take one tab po q 4-6 hrs prn pain 01/18/16   Tammy Triplett, PA-C  predniSONE (DELTASONE) 10 MG tablet Take 1 tablet (10 mg total) by mouth daily. 01/18/16   Tammy Triplett, PA-C  progesterone (PROMETRIUM) 200 MG capsule Take 1 capsule (200 mg total) by mouth daily. At bedtime 01/22/15   Florian Buff, MD    Family History Family History  Problem Relation Age of Onset  . Hypertension Mother   . Other Mother     lumpectomy  . Heart disease Mother   . Other Sister     lumpectomy  . Heart disease Sister     Social History Social History  Substance Use Topics  . Smoking status: Current Every Day Smoker    Packs/day: 0.50    Years: 15.00    Types: Cigarettes  . Smokeless tobacco: Current User  . Alcohol use Yes     Comment: rarely     Allergies  Ibuprofen and Meperidine hcl   Review of Systems Review of Systems  Constitutional: Negative for activity change.       All ROS Neg except as noted in HPI  HENT: Negative for nosebleeds.   Eyes: Negative for photophobia and discharge.  Respiratory: Negative for cough, shortness of breath and wheezing.   Cardiovascular: Negative for chest pain and palpitations.  Gastrointestinal: Negative for abdominal pain and blood in stool.  Genitourinary: Negative for bladder incontinence, dysuria, frequency and hematuria.  Musculoskeletal: Positive for back pain. Negative for arthralgias and neck pain.  Skin: Negative.   Neurological: Positive for tingling. Negative for dizziness,  seizures and speech difficulty.  Psychiatric/Behavioral: Negative for confusion and hallucinations.     Physical Exam Updated Vital Signs BP 143/83 (BP Location: Right Arm)   Pulse 120   Temp 99.3 F (37.4 C) (Oral)   Resp 18   Ht 5\' 2"  (1.575 m)   Wt 54.4 kg   LMP 09/22/2014   SpO2 100%   BMI 21.95 kg/m   Physical Exam  Constitutional: She is oriented to person, place, and time. She appears well-developed and well-nourished.  Non-toxic appearance.  HENT:  Head: Normocephalic.  Right Ear: Tympanic membrane and external ear normal.  Left Ear: Tympanic membrane and external ear normal.  Eyes: EOM and lids are normal. Pupils are equal, round, and reactive to light.  Neck: Normal range of motion. Neck supple. Carotid bruit is not present.  Cardiovascular: Normal rate, regular rhythm, normal heart sounds, intact distal pulses and normal pulses.   Pulmonary/Chest: Breath sounds normal. No respiratory distress.  Abdominal: Soft. Bowel sounds are normal. There is no tenderness. There is no guarding.  Musculoskeletal: Normal range of motion.  Pos straight leg raises bilat. Left lower back pain greater than left with attempted ROM.  Lymphadenopathy:       Head (right side): No submandibular adenopathy present.       Head (left side): No submandibular adenopathy present.    She has no cervical adenopathy.  Neurological: She is alert and oriented to person, place, and time. She has normal strength. No cranial nerve deficit or sensory deficit.  Motor strength symmetrical. No sensory deficits. Gait steady, no foot drop.  Skin: Skin is warm and dry.  Psychiatric: She has a normal mood and affect. Her speech is normal.  Nursing note and vitals reviewed.    ED Treatments / Results  Labs (all labs ordered are listed, but only abnormal results are displayed) Labs Reviewed - No data to display  EKG  EKG Interpretation None       Radiology No results  found.  Procedures Procedures (including critical care time)  Medications Ordered in ED Medications - No data to display   Initial Impression / Assessment and Plan / ED Course  I have reviewed the triage vital signs and the nursing notes.  Pertinent labs & imaging results that were available during my care of the patient were reviewed by me and considered in my medical decision making (see chart for details).  Clinical Course     *I have reviewed nursing notes, vital signs, and all appropriate lab and imaging results for this patient.**  Final Clinical Impressions(s) / ED Diagnoses  Reviewed previous ED records and imaging. No gross neuro deficits noted at this time. No evidence for cauda equina. Suggested heating pad and rest. Pt to follow up with Dr Wenda Overland to coordinate orthopedic involvement, pain management and possible MRI. Rx for Parafon forte, decadron, and  10 tabs of norco given ( allergic to NSAIDs).   Final diagnoses:  Sciatica of left side  Scoliosis, unspecified scoliosis type, unspecified spinal region    New Prescriptions New Prescriptions   No medications on file     Lily Kocher, PA-C 05/05/16 0911    Milton Ferguson, MD 05/06/16 [phone]

## 2016-05-05 NOTE — Discharge Instructions (Signed)
Please use a heating pad to your back. Rest your back as much as possible. See Dr Wenda Overland as soon as possible for orthopedic referral, possible MRI, and possible pain management assistance. Parafon Forte and norco may cause drowsiness, use these medications with caution.

## 2016-12-14 ENCOUNTER — Other Ambulatory Visit (HOSPITAL_COMMUNITY): Payer: Self-pay | Admitting: Internal Medicine

## 2016-12-14 DIAGNOSIS — M5432 Sciatica, left side: Secondary | ICD-10-CM

## 2016-12-21 ENCOUNTER — Ambulatory Visit (HOSPITAL_COMMUNITY): Payer: 59

## 2018-10-31 ENCOUNTER — Other Ambulatory Visit: Payer: Self-pay | Admitting: *Deleted

## 2018-10-31 DIAGNOSIS — Z20822 Contact with and (suspected) exposure to covid-19: Secondary | ICD-10-CM

## 2018-11-05 LAB — NOVEL CORONAVIRUS, NAA: SARS-CoV-2, NAA: NOT DETECTED

## 2019-03-28 ENCOUNTER — Ambulatory Visit
Admission: EM | Admit: 2019-03-28 | Discharge: 2019-03-28 | Disposition: A | Discharge status: Home / Self Care | Payer: Self-pay | Attending: Emergency Medicine | Admitting: Emergency Medicine

## 2019-03-28 ENCOUNTER — Other Ambulatory Visit: Payer: Self-pay

## 2019-03-28 DIAGNOSIS — R000 Tachycardia, unspecified: Secondary | ICD-10-CM

## 2019-03-28 DIAGNOSIS — M5442 Lumbago with sciatica, left side: Secondary | ICD-10-CM

## 2019-03-28 MED ORDER — CYCLOBENZAPRINE HCL 10 MG PO TABS: 10.0000 mg | ORAL_TABLET | Freq: Every day | ORAL | 0 refills | Status: DC

## 2019-03-28 MED ORDER — PREDNISONE 20 MG PO TABS: 20.0000 mg | ORAL_TABLET | Freq: Two times a day (BID) | ORAL | 0 refills | Status: AC

## 2019-03-28 NOTE — ED Provider Notes (Signed)
[facility]   [id number] 03/28/19 Arrival Time: [id number]  CC: Back pain  SUBJECTIVE: History from: patient. [name] [name] is a 48 y.o. female hx significant for DDD, DJD t-spine, endometriosis, fibroid uterus, and scoliosis, complains of left low back pain x 2 days.  Symptoms began after picking up a case of water.  Localizes the pain to the left low back.  Describes the pain as intermittent and achy in character.  5/10.  Has tried OTC medications without relief.  Symptoms are made worse with sitting and movement.  Denies similar symptoms in the past.  Does admit to numbness and tingling in lower extremities, as well as radiating symptoms into LT leg.  Denies fever, chills, chest pain, SOB, erythema, ecchymosis, effusion, weakness, saddle paresthesias, dysuria, hematuria, loss of bowel or bladder function.      ROS: As per HPI.  All other pertinent ROS negative.     Past Medical History:  Diagnosis Date  . Degenerative disc disease, lumbar   . 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 Endometriosis   . Fibroid uterus   . Scoliosis    Past Surgical History:  Procedure Laterality Date  . ABDOMINAL HYSTERECTOMY N/A 01/21/2015   Procedure: HYSTERECTOMY ABDOMINAL WITH IVP;  Surgeon: Florian Buff, MD;  Location: [facility];  Service: Gynecology;  Laterality: N/A;  . SALPINGOOPHORECTOMY Bilateral 01/21/2015   Procedure: BILATERAL SALPINGO OOPHORECTOMY;  Surgeon: Florian Buff, MD;  Location: [facility];  Service: Gynecology;  Laterality: Bilateral;   Allergies  Allergen Reactions  . Ibuprofen Diarrhea and Other (See Comments)    reflux  . Meperidine Hcl Nausea And Vomiting   No current facility-administered medications on file prior to encounter.    Current Outpatient Medications on File Prior to Encounter  Medication Sig Dispense Refill  . acetaminophen (TYLENOL) 500 MG tablet Take 500 mg by mouth every 6 (six) hours as needed for mild pain or moderate pain.    .  chlorzoxazone (PARAFON FORTE DSC) 500 MG tablet Take 1 tablet (500 mg total) by mouth 3 (three) times daily. 21 tablet 0  . [DISCONTINUED] estradiol (ESTRACE) 2 MG tablet Take 1 tablet (2 mg total) by mouth daily. (Patient not taking: Reported on 05/05/2016) 30 tablet 11  . [DISCONTINUED] progesterone (PROMETRIUM) 200 MG capsule Take 1 capsule (200 mg total) by mouth daily. At bedtime (Patient not taking: Reported on 05/05/2016) 30 capsule 11   Social History   Socioeconomic History  . Marital status: Married    Spouse name: Not on file  . Number of children: Not on file  . Years of education: Not on file  . Highest education level: Not on file  Occupational History  . Not on file  Social Needs  . Financial resource strain: Not on file  . Food insecurity    Worry: Not on file    Inability: Not on file  . Transportation needs    Medical: Not on file    Non-medical: Not on file  Tobacco Use  . Smoking status: Current Every Day Smoker    Packs/day: 0.50    Years: 15.00    Pack years: 7.50    Types: Cigarettes  . Smokeless tobacco: Current User  Substance and Sexual Activity  . Alcohol use: Yes    Comment: rarely  . Drug use: No  . Sexual activity: Yes  Lifestyle  . Physical activity    Days per week: Not on file    Minutes per  session: Not on file  . Stress: Not on file  Relationships  . Social Herbalist on phone: Not on file    Gets together: Not on file    Attends religious service: Not on file    Active member of club or organization: Not on file    Attends meetings of clubs or organizations: Not on file    Relationship status: Not on file  . Intimate partner violence    Fear of current or ex partner: Not on file    Emotionally abused: Not on file    Physically abused: Not on file    Forced sexual activity: Not on file  Other Topics Concern  . Not on file  Social History Narrative  . Not on file   Family History  Problem Relation Age of Onset  .  Hypertension Mother   . Other Mother        lumpectomy  . Heart disease Mother   . Other Sister        lumpectomy  . Heart disease Sister     OBJECTIVE:  Vitals:   03/28/19 1019  BP: (!) 145/87  Pulse: (!) 136  Resp: 16  Temp: 99 F (37.2 C)  TempSrc: Oral  SpO2: 97%    Pulse rechecked in room: 125 bpm  General appearance: ALERT; in no acute distress, appears mildly uncomfortable HENT: NCAT; PERRL, EOMI grossly; nares patent; oropharynx clear Lungs: Normal respiratory effort CV: Tachycardia Musculoskeletal: Back Inspection: Skin warm, dry, clear and intact without obvious erythema, effusion, or ecchymosis.  Palpation: TTP over left low back; no midline tenderness; scoliosis of the lumbar spine appreciated during exam ROM: FROM active and passive Strength: 5/5 shld abduction, 5/5 shld adduction, 5/5 elbow flexion, 5/5 elbow extension, 5/5 grip strength, 5/5 hip flexion, 5/5 knee abduction, 5/5 knee adduction, 5/5 knee flexion, 5/5 knee extension Skin: warm and dry Neurologic: Ambulates without difficulty; Sensation intact about the upper/ lower extremities Psychological: alert and cooperative; normal mood and affect  ASSESSMENT & PLAN:  1. Acute left-sided low back pain with left-sided sciatica   2. Tachycardia     Meds ordered this encounter  Medications  . predniSONE (DELTASONE) 20 MG tablet    Sig: Take 1 tablet (20 mg total) by mouth 2 (two) times daily with a meal for 5 days.    Dispense:  10 tablet    Refill:  0    Order Specific Question:   Supervising Provider    Answer:   Raylene Everts [id number]  . cyclobenzaprine (FLEXERIL) 10 MG tablet    Sig: Take 1 tablet (10 mg total) by mouth at bedtime.    Dispense:  15 tablet    Refill:  0    Order Specific Question:   Supervising Provider    Answer:   Raylene Everts [id number]    Continue conservative management of rest, ice, heat, and gentle stretches/ massages Prednisone prescribed.  Take as  directed and to completion Take cyclobenzaprine at nighttime for symptomatic relief. Avoid driving or operating heavy machinery while using medication. Follow up with PCP as needed Return or go to the ER if you have any new or worsening symptoms (fever, chills, chest pain, abdominal pain, changes in bowel or bladder habits, pain radiating into lower legs, symptoms do not improve with medications, etc...)   Heart rate elevated in office recheck in 24-48 hours.  This may be secondary to pain and/or drinking a red bull prior to visit.  If it continues to be >100 please follow up with PCP for further evaluation and management.    Reviewed expectations re: course of current medical issues. Questions answered. Outlined signs and symptoms indicating need for more acute intervention. Patient verbalized understanding. After Visit Summary given.    Lestine Box, PA-C 03/28/19 1041

## 2019-03-28 NOTE — ED Triage Notes (Signed)
Pt presents to UC w/ c/o left lower back pain x2 days. Pt states it started after picking up a case of water. Pt states it is painful when sitting and w/ movement.

## 2019-03-28 NOTE — Discharge Instructions (Signed)
Continue conservative management of rest, ice, heat, and gentle stretches/ massages Prednisone prescribed.  Take as directed and to completion Take cyclobenzaprine at nighttime for symptomatic relief. Avoid driving or operating heavy machinery while using medication. Follow up with PCP as needed Return or go to the ER if you have any new or worsening symptoms (fever, chills, chest pain, abdominal pain, changes in bowel or bladder habits, pain radiating into lower legs, symptoms do not improve with medications, etc...)   Heart rate elevated in office recheck in 24-48 hours.  This may be secondary to pain and/or drinking a red bull prior to visit.  If it continues to be >100 please follow up with PCP for further evaluation and management.

## 2020-02-24 ENCOUNTER — Ambulatory Visit
Admission: EM | Admit: 2020-02-24 | Discharge: 2020-02-24 | Disposition: A | Discharge status: Home / Self Care | Payer: Medicaid Other | Attending: Emergency Medicine | Admitting: Emergency Medicine

## 2020-02-24 ENCOUNTER — Other Ambulatory Visit: Payer: Self-pay

## 2020-02-24 ENCOUNTER — Encounter: Payer: Self-pay | Admitting: Emergency Medicine

## 2020-02-24 DIAGNOSIS — J069 Acute upper respiratory infection, unspecified: Secondary | ICD-10-CM

## 2020-02-24 DIAGNOSIS — F419 Anxiety disorder, unspecified: Secondary | ICD-10-CM

## 2020-02-24 MED ORDER — PREDNISONE 10 MG PO TABS: 20.0000 mg | ORAL_TABLET | Freq: Every day | ORAL | 0 refills | Status: DC

## 2020-02-24 MED ORDER — HYDROXYZINE HCL 25 MG PO TABS: 25.0000 mg | ORAL_TABLET | Freq: Four times a day (QID) | ORAL | 0 refills | Status: DC | PRN

## 2020-02-24 MED ORDER — FLUTICASONE PROPIONATE 50 MCG/ACT NA SUSP: 1.0000 | Freq: Every day | NASAL | 0 refills | Status: DC

## 2020-02-24 NOTE — ED Triage Notes (Signed)
Pt has headache that started on Thursday, had pcr covid test done on Friday that has came back negative.  Pt does report that she is under a lot of stress lately.

## 2020-02-24 NOTE — ED Provider Notes (Signed)
[place]   [id number] 02/24/20 Arrival Time: 0830   Chief Complaint  Patient presents with   Anxiety   URI     SUBJECTIVE: History from: patient.  [name] [name] is a 49 y.o. female who presents to the urgent care for complaint of stress and anxiety for the past few days.reports delivery of her parent has been taking care of them for a while.  States she does not have any other health and the situation is stressful.  Describes anxiety as feeling depressed and crying.  Currently not taking any medication.  Symptoms are made worse with situation at home.  Reports similar symptoms in the past that improved with anxiety medication.  She also states she used to take depression medication.  Denies HI or SI.  Patient denies fever, chills, anhedonia, difficulty sleeping, changes in normal activities, nausea, vomiting, chest pain, SOB, abdominal pain, changes in bowel or bladder habits.     She is also complaining of nasal congestion, postnasal drip, headache and ear pain for the past 5 days.  Reports she tested negative for COVID-19 via PCR.  Denies sick exposure to COVID, flu or strep.  Denies recent travel.  Has never tried any OTC medication.  Denies aggravating or alleviating factors.  Denies previous symptoms in the past.   Denies fever, chills, fatigue, sinus pain, rhinorrhea, sore throat, SOB, wheezing, chest pain, nausea, changes in bowel or bladder habits.      ROS: As per HPI.  All other pertinent ROS negative.      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    Endometriosis    Fibroid uterus    Scoliosis    Past Surgical History:  Procedure Laterality Date   ABDOMINAL HYSTERECTOMY N/A 01/21/2015   Procedure: HYSTERECTOMY ABDOMINAL WITH IVP;  Surgeon: Florian Buff, MD;  Location: [facility];  Service: Gynecology;  Laterality: N/A;   SALPINGOOPHORECTOMY Bilateral 01/21/2015    Procedure: BILATERAL SALPINGO OOPHORECTOMY;  Surgeon: Florian Buff, MD;  Location: [facility];  Service: Gynecology;  Laterality: Bilateral;   Allergies  Allergen Reactions   Ibuprofen Diarrhea and Other (See Comments)    reflux   Meperidine Hcl Nausea And Vomiting   No current facility-administered medications on file prior to encounter.   Current Outpatient Medications on File Prior to Encounter  Medication Sig Dispense Refill   acetaminophen (TYLENOL) 500 MG tablet Take 500 mg by mouth every 6 (six) hours as needed for mild pain or moderate pain.     chlorzoxazone (PARAFON FORTE DSC) 500 MG tablet Take 1 tablet (500 mg total) by mouth 3 (three) times daily. 21 tablet 0   cyclobenzaprine (FLEXERIL) 10 MG tablet Take 1 tablet (10 mg total) by mouth at bedtime. 15 tablet 0   [DISCONTINUED] estradiol (ESTRACE) 2 MG tablet Take 1 tablet (2 mg total) by mouth daily. (Patient not taking: Reported on 05/05/2016) 30 tablet 11   [DISCONTINUED] progesterone (PROMETRIUM) 200 MG capsule Take 1 capsule (200 mg total) by mouth daily. At bedtime (Patient not taking: Reported on 05/05/2016) 30 capsule 11   Social History   Socioeconomic History   Marital status: Married    Spouse name: Not on file   Number of children: Not on file   Years of education: Not on file   Highest education level: Not on file  Occupational History   Not on file  Tobacco Use  Smoking status: Former Smoker    Packs/day: 0.50    Years: 15.00    Pack years: 7.50    Types: Cigarettes    Quit date: 02/24/2019    Years since quitting: 1.0   Smokeless tobacco: Current User  Substance and Sexual Activity   Alcohol use: Yes    Comment: rarely   Drug use: No   Sexual activity: Yes  Other Topics Concern   Not on file  Social History Narrative   Not on file   Social Determinants of Health   Financial Resource Strain:    Difficulty of Paying Living Expenses: Not on file  Food Insecurity:    Worried  About Charity fundraiser in the Last Year: Not on file   [employer] of Food in the Last Year: Not on file  Transportation Needs:    Lack of Transportation (Medical): Not on file   Lack of Transportation (Non-Medical): Not on file  Physical Activity:    Days of Exercise per Week: Not on file   Minutes of Exercise per Session: Not on file  Stress:    Feeling of Stress : Not on file  Social Connections:    Frequency of Communication with Friends and Family: Not on file   Frequency of Social Gatherings with Friends and Family: Not on file   Attends Religious Services: Not on file   Active Member of Clubs or Organizations: Not on file   Attends Archivist Meetings: Not on file   Marital Status: Not on file  Intimate Partner Violence:    Fear of Current or Ex-Partner: Not on file   Emotionally Abused: Not on file   Physically Abused: Not on file   Sexually Abused: Not on file   Family History  Problem Relation Age of Onset   Hypertension Mother    Other Mother        lumpectomy   Heart disease Mother    Other Sister        lumpectomy   Heart disease Sister     OBJECTIVE:  Vitals:   02/24/20 0849  BP: 122/85  Pulse: (!) 106  Resp: 19  Temp: 99.9 F (37.7 C)  TempSrc: Oral  SpO2: 97%  Weight: 170 lb (77.1 kg)  Height: 5\' 1"  (1.549 m)     Physical Exam Vitals and nursing note reviewed.  Constitutional:      General: She is not in acute distress.    Appearance: Normal appearance. She is normal weight. She is not ill-appearing, toxic-appearing or diaphoretic.  HENT:     Head: Normocephalic.     Right Ear: Ear canal and external ear normal. A middle ear effusion is present.     Left Ear: Ear canal and external ear normal. A middle ear effusion is present.     Nose: Congestion present.     Mouth/Throat:     Mouth: Mucous membranes are moist.     Pharynx: No oropharyngeal exudate or posterior oropharyngeal erythema.  Cardiovascular:      Rate and Rhythm: Normal rate and regular rhythm.     Pulses: Normal pulses.     Heart sounds: Normal heart sounds. No murmur heard.  No friction rub. No gallop.   Pulmonary:     Effort: Pulmonary effort is normal. No respiratory distress.     Breath sounds: Normal breath sounds. No stridor. No wheezing, rhonchi or rales.  Chest:     Chest wall: No tenderness.  Neurological:  Mental Status: She is alert and oriented to person, place, and time.  Psychiatric:        Attention and Perception: Attention normal.        Mood and Affect: Mood normal.        Behavior: Behavior normal. Behavior is not agitated, aggressive or hyperactive. Behavior is cooperative.        Thought Content: Thought content normal. Thought content is not delusional. Thought content does not include suicidal ideation. Thought content does not include suicidal plan.     LABS:  No results found for this or any previous visit (from the past 24 hour(s)).   ASSESSMENT & PLAN:  1. Anxiety   2. Viral URI     Meds ordered this encounter  Medications   hydrOXYzine (ATARAX/VISTARIL) 25 MG tablet    Sig: Take 1 tablet (25 mg total) by mouth every 6 (six) hours as needed.    Dispense:  30 tablet    Refill:  0   predniSONE (DELTASONE) 10 MG tablet    Sig: Take 2 tablets (20 mg total) by mouth daily.    Dispense:  15 tablet    Refill:  0   fluticasone (FLONASE) 50 MCG/ACT nasal spray    Sig: Place 1 spray into both nostrils daily for 14 days.    Dispense:  16 g    Refill:  0    Discharge instructions   Rest and drink fluids Eat a well-balanced diet, and avoid excessive caffeine intake Continue with hydroxyzine nightly for symptom relief Some things you may try doing to help alleviate your symptoms include: keeping a journal, exercise, talking to a friend or relative, listening to music, going for a walk or hike outside, or other activities that you may find enjoyable PCP assistance initiated Recommending  further evaluation and management with PCP Return or go to ER if you have any new or worsening symptoms such as fever, chills, fatigue, worsening shortness of breath, wheezing, chest pain, nausea, vomiting, abdominal pain, changes in bowel or bladder habits, etc...   Get plenty of rest and push fluids Prednisone was prescribed Flonase for nasal congestion and runny nose Use medications daily for symptom relief Use OTC medications like ibuprofen or tylenol as needed fever or pain Call or go to the ED if you have any new or worsening symptoms such as fever, worsening cough, shortness of breath, chest tightness, chest pain, turning blue, changes in mental status, etc...   Reviewed expectations re: course of current medical issues. Questions answered. Outlined signs and symptoms indicating need for more acute intervention. Patient verbalized understanding. After Visit Summary given.         Emerson Monte, FNP 02/24/20 0930

## 2020-02-24 NOTE — Discharge Instructions (Signed)
Rest and drink fluids Eat a well-balanced diet, and avoid excessive caffeine intake Continue with hydroxyzine nightly for symptom relief Some things you may try doing to help alleviate your symptoms include: keeping a journal, exercise, talking to a friend or relative, listening to music, going for a walk or hike outside, or other activities that you may find enjoyable PCP assistance initiated Recommending further evaluation and management with PCP Return or go to ER if you have any new or worsening symptoms such as fever, chills, fatigue, worsening shortness of breath, wheezing, chest pain, nausea, vomiting, abdominal pain, changes in bowel or bladder habits, etc...   Get plenty of rest and push fluids Prednisone was prescribed Flonase for nasal congestion and runny nose Use medications daily for symptom relief Use OTC medications like ibuprofen or tylenol as needed fever or pain Call or go to the ED if you have any new or worsening symptoms such as fever, worsening cough, shortness of breath, chest tightness, chest pain, turning blue, changes in mental status, etc..[name]

## 2020-05-22 ENCOUNTER — Ambulatory Visit
Admission: EM | Admit: 2020-05-22 | Discharge: 2020-05-22 | Disposition: A | Discharge status: Home / Self Care | Payer: Medicaid Other | Attending: Emergency Medicine | Admitting: Emergency Medicine

## 2020-05-22 ENCOUNTER — Encounter: Payer: Self-pay | Admitting: Emergency Medicine

## 2020-05-22 ENCOUNTER — Other Ambulatory Visit: Payer: Self-pay

## 2020-05-22 DIAGNOSIS — J029 Acute pharyngitis, unspecified: Secondary | ICD-10-CM

## 2020-05-22 DIAGNOSIS — R519 Headache, unspecified: Secondary | ICD-10-CM

## 2020-05-22 DIAGNOSIS — Z1152 Encounter for screening for COVID-19: Secondary | ICD-10-CM

## 2020-05-22 DIAGNOSIS — Z20822 Contact with and (suspected) exposure to covid-19: Secondary | ICD-10-CM | POA: Diagnosis not present

## 2020-05-22 DIAGNOSIS — J069 Acute upper respiratory infection, unspecified: Secondary | ICD-10-CM

## 2020-05-22 MED ORDER — CETIRIZINE HCL 10 MG PO TABS: 10.0000 mg | ORAL_TABLET | Freq: Every day | ORAL | 0 refills | Status: DC

## 2020-05-22 MED ORDER — FLUTICASONE PROPIONATE 50 MCG/ACT NA SUSP: 1.0000 | Freq: Every day | NASAL | 0 refills | Status: DC

## 2020-05-22 MED ORDER — PREDNISONE 10 MG PO TABS: 20.0000 mg | ORAL_TABLET | Freq: Every day | ORAL | 0 refills | Status: DC

## 2020-05-22 MED ORDER — BENZONATATE 100 MG PO CAPS: 100.0000 mg | ORAL_CAPSULE | Freq: Three times a day (TID) | ORAL | 0 refills | Status: DC | PRN

## 2020-05-22 NOTE — ED Provider Notes (Signed)
[place]   [id number] 05/22/20 Arrival Time: 0932   CC: COVID symptoms  SUBJECTIVE: History from: patient.  [name] [name] is a 50 y.o. female who presented to the urgent care for complaint of fever, sore throat, cough and  headache for the past 1 day.  States she was sick as well 3 weeks ago.  Denies sick exposure to COVID, flu or strep.  Denies recent travel.  Has tried OTC medication without relief.  Denies alleviating or aggravating factors.  Denies previous symptoms in the past.   Denies fever, chills, fatigue, sinus pain, rhinorrhea, sore throat, SOB, wheezing, chest pain, nausea, changes in bowel or bladder habits.     ROS: As per HPI.  All other pertinent ROS negative.      Past Medical History:  Diagnosis Date  . Degenerative disc disease, lumbar   . 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 Endometriosis   . Fibroid uterus   . Scoliosis    Past Surgical History:  Procedure Laterality Date  . ABDOMINAL HYSTERECTOMY N/A 01/21/2015   Procedure: HYSTERECTOMY ABDOMINAL WITH IVP;  Surgeon: Florian Buff, MD;  Location: [facility];  Service: Gynecology;  Laterality: N/A;  . SALPINGOOPHORECTOMY Bilateral 01/21/2015   Procedure: BILATERAL SALPINGO OOPHORECTOMY;  Surgeon: Florian Buff, MD;  Location: [facility];  Service: Gynecology;  Laterality: Bilateral;   Allergies  Allergen Reactions  . Ibuprofen Diarrhea and Other (See Comments)    reflux  . Meperidine Hcl Nausea And Vomiting   No current facility-administered medications on file prior to encounter.   Current Outpatient Medications on File Prior to Encounter  Medication Sig Dispense Refill  . acetaminophen (TYLENOL) 500 MG tablet Take 500 mg by mouth every 6 (six) hours as needed for mild pain or moderate pain.    . chlorzoxazone (PARAFON FORTE DSC) 500 MG tablet Take 1 tablet (500 mg total) by mouth 3 (three) times daily. 21 tablet 0  . cyclobenzaprine (FLEXERIL) 10 MG tablet Take 1 tablet (10 mg  total) by mouth at bedtime. 15 tablet 0  . hydrOXYzine (ATARAX/VISTARIL) 25 MG tablet Take 1 tablet (25 mg total) by mouth every 6 (six) hours as needed. 30 tablet 0  . [DISCONTINUED] estradiol (ESTRACE) 2 MG tablet Take 1 tablet (2 mg total) by mouth daily. (Patient not taking: Reported on 05/05/2016) 30 tablet 11  . [DISCONTINUED] progesterone (PROMETRIUM) 200 MG capsule Take 1 capsule (200 mg total) by mouth daily. At bedtime (Patient not taking: Reported on 05/05/2016) 30 capsule 11   Social History   Socioeconomic History  . Marital status: Married    Spouse name: Not on file  . Number of children: Not on file  . Years of education: Not on file  . Highest education level: Not on file  Occupational History  . Not on file  Tobacco Use  . Smoking status: Former Smoker    Packs/day: 0.50    Years: 15.00    Pack years: 7.50    Types: Cigarettes    Quit date: 02/24/2019    Years since quitting: 1.2  . Smokeless tobacco: Current User  Substance and Sexual Activity  . Alcohol use: Yes    Comment: rarely  . Drug use: No  . Sexual activity: Yes  Other Topics Concern  . Not on file  Social History Narrative  . Not on file   Social Determinants of Health   Financial Resource Strain: Not on file  Food Insecurity: Not on file  Transportation Needs: Not on file  Physical Activity: Not on file  Stress: Not on file  Social Connections: Not on file  Intimate Partner Violence: Not on file   Family History  Problem Relation Age of Onset  . Hypertension Mother   . Other Mother        lumpectomy  . Heart disease Mother   . Other Sister        lumpectomy  . Heart disease Sister     OBJECTIVE:  Vitals:   05/22/20 0943 05/22/20 0944  BP: 127/75   Pulse: (!) 107   Resp: 18   Temp: 99.6 F (37.6 C)   TempSrc: Oral   SpO2: 95%   Weight:  170 lb (77.1 kg)  Height:  5\' 2"  (1.575 m)     General appearance: alert; appears fatigued, but nontoxic; speaking in full sentences and  tolerating own secretions HEENT: NCAT; Ears: EACs clear, TMs pearly gray; Eyes: PERRL.  EOM grossly intact. Sinuses: nontender; Nose: nares patent without rhinorrhea, Throat: oropharynx clear, tonsils non erythematous or enlarged, uvula midline  Neck: supple without LAD Lungs: unlabored respirations, symmetrical air entry; cough: mild headache; no respiratory distress; CTAB Heart: regular rate and rhythm.  Radial pulses 2+ symmetrical bilaterally Skin: warm and dry Psychological: alert and cooperative; normal mood and affect  LABS:  No results found for this or any previous visit (from the past 24 hour(s)).   ASSESSMENT & PLAN:  1. Encounter for screening for COVID-19   2. Sore throat   3. Acute nonintractable headache, unspecified headache type   4. Viral URI with cough     Meds ordered this encounter  Medications  . cetirizine (ZYRTEC ALLERGY) 10 MG tablet    Sig: Take 1 tablet (10 mg total) by mouth daily.    Dispense:  30 tablet    Refill:  0  . fluticasone (FLONASE) 50 MCG/ACT nasal spray    Sig: Place 1 spray into both nostrils daily for 14 days.    Dispense:  16 g    Refill:  0  . benzonatate (TESSALON) 100 MG capsule    Sig: Take 1 capsule (100 mg total) by mouth 3 (three) times daily as needed for cough.    Dispense:  30 capsule    Refill:  0  . predniSONE (DELTASONE) 10 MG tablet    Sig: Take 2 tablets (20 mg total) by mouth daily.    Dispense:  15 tablet    Refill:  0    Discharge Instructions  COVID-19, flu A/B testing ordered.  It will take between 2-7 days for test results.  Someone will contact you regarding abnormal results.     Get plenty of rest and push fluids Tessalon Perles prescribed for cough Zyrtec for nasal congestion, runny nose, and/or sore throat Flonase for nasal congestion and runny nose Use medications daily for symptom relief Use OTC medications like ibuprofen or tylenol as needed fever or pain Call or go to the ED if you have any new  or worsening symptoms such as fever, worsening cough, shortness of breath, chest tightness, chest pain, turning blue, changes in mental status, etc...   Reviewed expectations re: course of current medical issues. Questions answered. Outlined signs and symptoms indicating need for more acute intervention. Patient verbalized understanding. After Visit Summary given.         Emerson Monte, FNP 05/22/20 1022

## 2020-05-22 NOTE — ED Triage Notes (Signed)
Fever, sore throat, headache, week feeling since yesterday.  Sates she was sick 3 weeks ago, but new symptoms now.

## 2020-05-23 LAB — COVID-19, FLU A+B NAA
Influenza A, NAA: NOT DETECTED
Influenza B, NAA: NOT DETECTED
SARS-CoV-2, NAA: NOT DETECTED

## 2020-09-08 ENCOUNTER — Encounter: Payer: Self-pay | Admitting: Emergency Medicine

## 2020-09-08 ENCOUNTER — Ambulatory Visit
Admission: EM | Admit: 2020-09-08 | Discharge: 2020-09-08 | Disposition: A | Discharge status: Home / Self Care | Payer: Medicaid Other | Attending: Family Medicine | Admitting: Family Medicine

## 2020-09-08 ENCOUNTER — Other Ambulatory Visit: Payer: Self-pay

## 2020-09-08 DIAGNOSIS — A084 Viral intestinal infection, unspecified: Secondary | ICD-10-CM

## 2020-09-08 MED ORDER — ONDANSETRON HCL 4 MG PO TABS: 4.0000 mg | ORAL_TABLET | Freq: Four times a day (QID) | ORAL | 0 refills | Status: DC

## 2020-09-08 NOTE — ED Provider Notes (Signed)
[facility]    CSN: [id number] Arrival date & time: 09/08/20  1138      History   Chief Complaint No chief complaint on file.   HPI [name] [name] is a 50 y.o. female.   Reports diarrhea since yesterday. States that her stool has been very watery. Reports decreased appetite, but has been able to tolerate fluids. States that when she eats it goes straight through her. Has use rolaids with some temporary relief. Denies sick contacts. Denies hx Covid. Has not completed Covid vaccines. Has not completed flu vaccine. Denies headache, chills, abdominal pain/cramping, nausea, vomiting, rash, fever, other symptoms.  ROS per HPI  The history is provided by the patient.    Past Medical History:  Diagnosis Date  . Degenerative disc disease, lumbar   . 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 Endometriosis   . Fibroid uterus   . Scoliosis     Patient Active Problem List   Diagnosis Date Noted  . S/P total hysterectomy and bilateral salpingo-oophorectomy 01/21/2015  . DEPRESSION 12/15/2006    Past Surgical History:  Procedure Laterality Date  . ABDOMINAL HYSTERECTOMY N/A 01/21/2015   Procedure: HYSTERECTOMY ABDOMINAL WITH IVP;  Surgeon: Florian Buff, MD;  Location: [facility];  Service: Gynecology;  Laterality: N/A;  . SALPINGOOPHORECTOMY Bilateral 01/21/2015   Procedure: BILATERAL SALPINGO OOPHORECTOMY;  Surgeon: Florian Buff, MD;  Location: [facility];  Service: Gynecology;  Laterality: Bilateral;    OB History   No obstetric history on file.      Home Medications    Prior to Admission medications   Medication Sig Start Date End Date Taking? Authorizing Provider  ondansetron (ZOFRAN) 4 MG tablet Take 1 tablet (4 mg total) by mouth every 6 (six) hours. 09/08/20  Yes Faustino Congress, NP  acetaminophen (TYLENOL) 500 MG tablet Take 500 mg by mouth every 6 (six) hours as needed for mild pain or moderate pain.    [provider]  benzonatate  (TESSALON) 100 MG capsule Take 1 capsule (100 mg total) by mouth 3 (three) times daily as needed for cough. 05/22/20   Avegno, Darrelyn Hillock, FNP  cetirizine (ZYRTEC ALLERGY) 10 MG tablet Take 1 tablet (10 mg total) by mouth daily. 05/22/20   Avegno, Darrelyn Hillock, FNP  chlorzoxazone (PARAFON FORTE DSC) 500 MG tablet Take 1 tablet (500 mg total) by mouth 3 (three) times daily. 05/05/16   Lily Kocher, PA-C  cyclobenzaprine (FLEXERIL) 10 MG tablet Take 1 tablet (10 mg total) by mouth at bedtime. 03/28/19   Wurst, [place], PA-C  fluticasone (FLONASE) 50 MCG/ACT nasal spray Place 1 spray into both nostrils daily for 14 days. 05/22/20 06/05/20  Avegno, Darrelyn Hillock, FNP  hydrOXYzine (ATARAX/VISTARIL) 25 MG tablet Take 1 tablet (25 mg total) by mouth every 6 (six) hours as needed. 02/24/20   Avegno, Darrelyn Hillock, FNP  predniSONE (DELTASONE) 10 MG tablet Take 2 tablets (20 mg total) by mouth daily. 05/22/20   Avegno, Darrelyn Hillock, FNP  estradiol (ESTRACE) 2 MG tablet Take 1 tablet (2 mg total) by mouth daily. Patient not taking: Reported on 05/05/2016 02/27/15 03/28/19  Florian Buff, MD  progesterone (PROMETRIUM) 200 MG capsule Take 1 capsule (200 mg total) by mouth daily. At bedtime Patient not taking: Reported on 05/05/2016 01/22/15 03/28/19  Florian Buff, MD    Family History Family History  Problem Relation Age of Onset  . Hypertension Mother   . Other Mother  lumpectomy  . Heart disease Mother   . Other Sister        lumpectomy  . Heart disease Sister     Social History Social History   Tobacco Use  . Smoking status: Former Smoker    Packs/day: 0.50    Years: 15.00    Pack years: 7.50    Types: Cigarettes    Quit date: 02/24/2019    Years since quitting: 1.5  . Smokeless tobacco: Current User  Substance Use Topics  . Alcohol use: Yes    Comment: rarely  . Drug use: No     Allergies   Ibuprofen and Meperidine hcl   Review of Systems Review of Systems   Physical Exam Triage Vital  Signs ED Triage Vitals  Enc Vitals Group     BP 09/08/20 1245 127/82     Pulse Rate 09/08/20 1245 85     Resp 09/08/20 1245 17     Temp 09/08/20 1245 99.3 F (37.4 C)     Temp Source 09/08/20 1245 Oral     SpO2 09/08/20 1245 98 %     Weight --      Height --      Head Circumference --      Peak Flow --      Pain Score 09/08/20 1300 0     Pain Loc --      Pain Edu? --      Excl. in [place]? --    No data found.  Updated Vital Signs BP 127/82 (BP Location: Right Arm)   Pulse 85   Temp 99.3 F (37.4 C) (Oral)   Resp 17   LMP 09/22/2014   SpO2 98%   Visual Acuity Right Eye Distance:   Left Eye Distance:   Bilateral Distance:    Right Eye Near:   Left Eye Near:    Bilateral Near:     Physical Exam Vitals and nursing note reviewed.  Constitutional:      General: She is not in acute distress.    Appearance: She is well-developed and normal weight. She is not ill-appearing.  HENT:     Head: Normocephalic and atraumatic.     Nose: Nose normal.     Mouth/Throat:     Mouth: Mucous membranes are moist.     Pharynx: Oropharynx is clear.  Eyes:     Extraocular Movements: Extraocular movements intact.     Conjunctiva/sclera: Conjunctivae normal.     Pupils: Pupils are equal, round, and reactive to light.  Cardiovascular:     Rate and Rhythm: Normal rate and regular rhythm.     Heart sounds: Normal heart sounds. No murmur heard.   Pulmonary:     Effort: Pulmonary effort is normal. No respiratory distress.     Breath sounds: Normal breath sounds. No stridor. No wheezing, rhonchi or rales.  Chest:     Chest wall: No tenderness.  Abdominal:     General: There is no distension.     Palpations: Abdomen is soft. There is no mass.     Tenderness: There is abdominal tenderness (lower abdomen with deep palpation). There is no right CVA tenderness, left CVA tenderness, guarding or rebound.     Hernia: No hernia is present.     Comments: Hyperactive bowel sounds    Musculoskeletal:        General: Normal range of motion.     Cervical back: Normal range of motion and neck supple.  Skin:  General: Skin is warm and dry.     Capillary Refill: Capillary refill takes less than 2 seconds.  Neurological:     General: No focal deficit present.     Mental Status: She is alert and oriented to person, place, and time.  Psychiatric:        Mood and Affect: Mood normal.        Behavior: Behavior normal.        Thought Content: Thought content normal.      UC Treatments / Results  Labs (all labs ordered are listed, but only abnormal results are displayed) Labs Reviewed - No data to display  EKG   Radiology No results found.  Procedures Procedures (including critical care time)  Medications Ordered in UC Medications - No data to display  Initial Impression / Assessment and Plan / UC Course  I have reviewed the triage vital signs and the nursing notes.  Pertinent labs & imaging results that were available during my care of the patient were reviewed by me and considered in my medical decision making (see chart for details).    Viral Gastroenteritis  May take imodium over the counter as needed for diarrhea Prescribed zofran as needed Push fluids and get some rest Follow up with this office or with primary care if symptoms are persisting. Follow up in the ER for high fever, trouble swallowing, trouble breathing, other concerning symptoms.   Final Clinical Impressions(s) / UC Diagnoses   Final diagnoses:  Viral gastroenteritis     Discharge Instructions     I have sent in Zofran for you to take one tablet every 8 hours as needed  Follow up with this office or with primary care if symptoms are persisting.  Follow up in the ER for high fever, trouble swallowing, trouble breathing, other concerning symptoms.     ED Prescriptions    Medication Sig Dispense Auth. Provider   ondansetron (ZOFRAN) 4 MG tablet Take 1 tablet (4 mg  total) by mouth every 6 (six) hours. 12 tablet Faustino Congress, NP     PDMP not reviewed this encounter.   Faustino Congress, NP 09/08/20 1340

## 2020-09-08 NOTE — ED Triage Notes (Signed)
Pt states severe diarrhea since yesterday. Cannot eat anything without it going right through her.

## 2020-09-08 NOTE — Discharge Instructions (Addendum)
I have sent in Zofran for you to take one tablet every 8 hours as needed  Follow up with this office or with primary care if symptoms are persisting.  Follow up in the ER for high fever, trouble swallowing, trouble breathing, other concerning symptoms.

## 2020-12-09 ENCOUNTER — Encounter (HOSPITAL_COMMUNITY): Payer: Self-pay | Admitting: Emergency Medicine

## 2020-12-09 ENCOUNTER — Other Ambulatory Visit: Payer: Self-pay

## 2020-12-09 ENCOUNTER — Emergency Department (HOSPITAL_COMMUNITY)
Admission: EM | Admit: 2020-12-09 | Discharge: 2020-12-09 | Disposition: A | Discharge status: Home / Self Care | Payer: No Typology Code available for payment source | Attending: Emergency Medicine | Admitting: Emergency Medicine

## 2020-12-09 DIAGNOSIS — Y92410 Unspecified street and highway as the place of occurrence of the external cause: Secondary | ICD-10-CM | POA: Diagnosis not present

## 2020-12-09 DIAGNOSIS — Z23 Encounter for immunization: Secondary | ICD-10-CM | POA: Diagnosis not present

## 2020-12-09 DIAGNOSIS — S20312A Abrasion of left front wall of thorax, initial encounter: Secondary | ICD-10-CM | POA: Insufficient documentation

## 2020-12-09 DIAGNOSIS — M549 Dorsalgia, unspecified: Secondary | ICD-10-CM | POA: Diagnosis not present

## 2020-12-09 DIAGNOSIS — S299XXA Unspecified injury of thorax, initial encounter: Secondary | ICD-10-CM | POA: Diagnosis present

## 2020-12-09 DIAGNOSIS — Z87891 Personal history of nicotine dependence: Secondary | ICD-10-CM | POA: Diagnosis not present

## 2020-12-09 DIAGNOSIS — M25519 Pain in unspecified shoulder: Secondary | ICD-10-CM | POA: Diagnosis not present

## 2020-12-09 DIAGNOSIS — T07XXXA Unspecified multiple injuries, initial encounter: Secondary | ICD-10-CM

## 2020-12-09 DIAGNOSIS — M542 Cervicalgia: Secondary | ICD-10-CM | POA: Diagnosis not present

## 2020-12-09 MED ORDER — TETANUS-DIPHTH-ACELL PERTUSSIS 5-2.5-18.5 LF-MCG/0.5 IM SUSY
0.5000 mL | PREFILLED_SYRINGE | Freq: Once | INTRAMUSCULAR | Status: AC
  Administered 2020-12-09: 0.5 mL via INTRAMUSCULAR
  Filled 2020-12-09: qty 0.5

## 2020-12-09 MED ORDER — METHOCARBAMOL 500 MG PO TABS: 500.0000 mg | ORAL_TABLET | Freq: Four times a day (QID) | ORAL | 0 refills | Status: DC

## 2020-12-09 MED ORDER — ACETAMINOPHEN 325 MG PO TABS
650.0000 mg | ORAL_TABLET | Freq: Four times a day (QID) | ORAL | Status: DC | PRN
  Administered 2020-12-09: 650 mg via ORAL
  Filled 2020-12-09: qty 2
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
## 2020-12-09 NOTE — Discharge Instructions (Addendum)
Return if any problems.

## 2021-05-24 ENCOUNTER — Ambulatory Visit
Admission: EM | Admit: 2021-05-24 | Discharge: 2021-05-24 | Disposition: A | Discharge status: Home / Self Care | Payer: Medicaid Other | Attending: Urgent Care | Admitting: Urgent Care

## 2021-05-24 ENCOUNTER — Other Ambulatory Visit: Payer: Self-pay

## 2021-05-24 DIAGNOSIS — R21 Rash and other nonspecific skin eruption: Secondary | ICD-10-CM

## 2021-05-24 DIAGNOSIS — B349 Viral infection, unspecified: Secondary | ICD-10-CM | POA: Diagnosis not present

## 2021-05-24 DIAGNOSIS — M2550 Pain in unspecified joint: Secondary | ICD-10-CM

## 2021-05-24 DIAGNOSIS — M545 Low back pain, unspecified: Secondary | ICD-10-CM

## 2021-05-24 DIAGNOSIS — G8929 Other chronic pain: Secondary | ICD-10-CM

## 2021-05-24 DIAGNOSIS — Z20822 Contact with and (suspected) exposure to covid-19: Secondary | ICD-10-CM | POA: Diagnosis not present

## 2021-05-24 MED ORDER — PREDNISONE 20 MG PO TABS: ORAL_TABLET | ORAL | 0 refills | Status: DC

## 2021-05-24 MED ORDER — TIZANIDINE HCL 4 MG PO TABS: 4.0000 mg | ORAL_TABLET | Freq: Every day | ORAL | 0 refills | Status: DC

## 2021-05-24 MED ORDER — ACETAMINOPHEN 325 MG PO TABS: 650.0000 mg | ORAL_TABLET | Freq: Four times a day (QID) | ORAL | 0 refills | Status: DC | PRN

## 2021-05-24 NOTE — Discharge Instructions (Addendum)
Please try and set up your primary care through the [facility] website. You can simply go online and do a search for [facility]. Then select family medicine in a city you would like to be seen for a new patient. They will provide you with options and appointment times.

## 2021-05-24 NOTE — ED Triage Notes (Signed)
Pt reports lower left sided back pain, left knee pain, left ankle pain fever, on and off for years, flared up 3 days ago.  Pt reports diarrhea  x 2 days.

## 2021-05-24 NOTE — ED Provider Notes (Signed)
[name] [name]   MRN: [id number] DOB: [birth year]  Subjective:   [name] [name] is a 51 y.o. female presenting for acute on chronic multiple joint pains.  Symptoms started to flareup 3 days ago.  Has had chronic joint pains, joint swelling intermittently over the years.  Has also had back pains intermittently.  These all flared up over the weekend.  Has felt fatigued and also started to have diarrhea.  Used to have a PCP years ago and underwent a lot of testing but cannot remember any specific diagnosis.  Ultimately, she did end up going to see a specialist like a dermatologist that she has chronic plaques over her legs and torso.  She cannot member specific diagnosis but recalls that she was told she has some type of autoimmune disorder.  No chest pain, shortness of breath, wheezing, nausea, vomiting, abdominal pain, recent traumas.  She does have a history of degenerative disc disease of the lumbar region and degenerative joint disease and thoracic back.  No current facility-administered medications for this encounter.  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    Endometriosis    Fibroid uterus    Scoliosis      Past Surgical History:  Procedure Laterality Date   ABDOMINAL HYSTERECTOMY N/A 01/21/2015   Procedure: HYSTERECTOMY ABDOMINAL WITH IVP;  Surgeon: Florian Buff, MD;  Location: [facility];  Service: Gynecology;  Laterality: N/A;   SALPINGOOPHORECTOMY Bilateral 01/21/2015   Procedure: BILATERAL SALPINGO OOPHORECTOMY;  Surgeon: Florian Buff, MD;  Location: [facility];  Service: Gynecology;   Laterality: Bilateral;    Family History  Problem Relation Age of Onset   Hypertension Mother    Other Mother        lumpectomy   Heart disease Mother    Other Sister        lumpectomy   Heart disease Sister     Social History   Tobacco Use   Smoking status: Former    Packs/day: 0.50    Years: 15.00    Pack years: 7.50    Types: Cigarettes    Quit date: 02/24/2019    Years since quitting: 2.2   Smokeless tobacco: Current  Substance Use Topics   Alcohol use: Yes    Comment: rarely   Drug use: No    ROS   Objective:   Vitals: BP (!) 130/7 (BP Location: Right Arm)    Pulse 91    Temp 99.7 F (37.6 C) (Oral)    Resp 18    LMP 09/22/2014    SpO2 95%   Physical Exam Constitutional:      General: She is not in acute distress.    Appearance: Normal appearance. She is well-developed. She is not ill-appearing, toxic-appearing or diaphoretic.  HENT:     Head: Normocephalic and atraumatic.     Right Ear: Tympanic membrane, ear canal and external ear normal.     Left Ear: Tympanic membrane, ear canal and external ear normal.     Nose: Nose normal.     Mouth/Throat:  Mouth: Mucous membranes are moist.     Pharynx: No oropharyngeal exudate or posterior oropharyngeal erythema.  Eyes:     General: No scleral icterus.       Right eye: No discharge.        Left eye: No discharge.     Extraocular Movements: Extraocular movements intact.     Conjunctiva/sclera: Conjunctivae normal.  Cardiovascular:     Rate and Rhythm: Normal rate.     Heart sounds: No murmur heard.   No friction rub. No gallop.  Pulmonary:     Effort: Pulmonary effort is normal. No respiratory distress.     Breath sounds: No stridor. No wheezing, rhonchi or rales.  Chest:     Chest wall: No tenderness.  Musculoskeletal:        General: Tenderness (of multiple joints including knees, ankles, low back bilaterally) present.  Skin:    General: Skin is warm and dry.     Findings: Rash (Multiple  plaque-like rashes some silvery in appearance diffusely scattered over her lower legs and arms) present.  Neurological:     General: No focal deficit present.     Mental Status: She is alert and oriented to person, place, and time.  Psychiatric:        Mood and Affect: Mood normal.        Behavior: Behavior normal.        Thought Content: Thought content normal.        Judgment: Judgment normal.    Assessment and Plan :   PDMP not reviewed this encounter.  1. Acute viral syndrome   2. Rash and nonspecific skin eruption   3. Chronic pain of multiple joints   4. Acute bilateral low back pain without sciatica    COVID-19 testing pending.  Recommended supportive care for an acute viral syndrome.  Also suspect the patient has an autoimmune inflammatory condition including psoriasis, psoriatic arthritis, lupus.  Provided her with information on how to establish care with a new primary care provider.  In the meantime, we will offer her an oral prednisone course for her current flareup.  Will defer testing to her new PCP.  Counseled patient on potential for adverse effects with medications prescribed/recommended today, ER and return-to-clinic precautions discussed, patient verbalized understanding.    Jaynee Eagles, PA-C 05/24/21 1526

## 2021-05-25 LAB — SARS-COV-2, NAA 2 DAY TAT

## 2021-05-25 LAB — NOVEL CORONAVIRUS, NAA: SARS-CoV-2, NAA: NOT DETECTED

## 2021-05-30 ENCOUNTER — Emergency Department (HOSPITAL_COMMUNITY): Payer: Medicaid Other

## 2021-05-30 ENCOUNTER — Other Ambulatory Visit: Payer: Self-pay

## 2021-05-30 ENCOUNTER — Emergency Department (HOSPITAL_COMMUNITY)
Admission: EM | Admit: 2021-05-30 | Discharge: 2021-05-31 | Disposition: A | Discharge status: Home / Self Care | Payer: Medicaid Other | Attending: Emergency Medicine | Admitting: Emergency Medicine

## 2021-05-30 ENCOUNTER — Encounter (HOSPITAL_COMMUNITY): Payer: Self-pay

## 2021-05-30 DIAGNOSIS — R21 Rash and other nonspecific skin eruption: Secondary | ICD-10-CM

## 2021-05-30 DIAGNOSIS — R002 Palpitations: Secondary | ICD-10-CM

## 2021-05-30 DIAGNOSIS — F419 Anxiety disorder, unspecified: Secondary | ICD-10-CM

## 2021-05-30 DIAGNOSIS — M7989 Other specified soft tissue disorders: Secondary | ICD-10-CM | POA: Insufficient documentation

## 2021-05-30 DIAGNOSIS — R0602 Shortness of breath: Secondary | ICD-10-CM | POA: Insufficient documentation

## 2021-05-30 LAB — COMPREHENSIVE METABOLIC PANEL
ALT: 22 U/L (ref 0–44)
AST: 22 U/L (ref 15–41)
Albumin: 5.1 g/dL — ABNORMAL HIGH (ref 3.5–5.0)
Alkaline Phosphatase: 81 U/L (ref 38–126)
Anion gap: 12 (ref 5–15)
BUN: 21 mg/dL — ABNORMAL HIGH (ref 6–20)
CO2: 25 mmol/L (ref 22–32)
Calcium: 9.2 mg/dL (ref 8.9–10.3)
Chloride: 100 mmol/L (ref 98–111)
Creatinine, Ser: 0.71 mg/dL (ref 0.44–1.00)
GFR, Estimated: >60 mL/min (ref >60)
Glucose, Bld: 97 mg/dL (ref 70–99)
Potassium: 3.4 mmol/L — ABNORMAL LOW (ref 3.5–5.1)
Sodium: 137 mmol/L (ref 135–145)
Total Bilirubin: 0.5 mg/dL (ref 0.3–1.2)
Total Protein: 7.7 g/dL (ref 6.5–8.1)

## 2021-05-30 NOTE — ED Triage Notes (Addendum)
Pov from home with cc of sob. Possibly caused by anxiety. Said she was sitting on the couch this evening when she started feeling flushed and felt her heart racing. Pt ambulatory to tx room from lobby an dis 98%on room air with a HR of 85. Said she was recently seen at urgent care for pains and was given a script for prednisone. Only pain she has now is her chronic shoulder and pack pains.  The only thing she has had to eat today was grilled chicken for dinner and a sandwich for breakfast.

## 2021-05-30 NOTE — ED Provider Notes (Signed)
11:05 PM Assumed care from Dr. Sabra Heck, please see their note for full history, physical and decision making until this point. In brief this is a 51 y.o. year old female who presented to the ED tonight with Shortness of Breath     Pending labs.   Labs are reassuring. I suspect this could be related to anxiety. Lower suspicion for ACS, PE, dehydration or other acute causes for symptoms. List given for pcp follow up (also has a list from recent UC visit).   Discharge instructions, including strict return precautions for new or worsening symptoms, given. Patient and/or family verbalized understanding and agreement with the plan as described.   Labs, studies and imaging reviewed by myself and considered in medical decision making if ordered. Imaging interpreted by radiology.  Labs Reviewed  SEDIMENTATION RATE  C-REACTIVE PROTEIN  COMPREHENSIVE METABOLIC PANEL  CBC WITH DIFFERENTIAL/PLATELET    DG Chest Port 1 View    (Results Pending)    No follow-ups on file.    [name] [name], Corene Cornea, MD 05/31/21 [phone]

## 2021-05-30 NOTE — ED Provider Notes (Signed)
[facility] Provider Note   CSN: [id number] Arrival date & time: 05/30/21  2218     History  Chief Complaint  Patient presents with   Shortness of Breath    [name] [name] is a 51 y.o. female.   Shortness of Breath  This patient is a 51 year old female, she has no significant chronic prior medical history that she is aware of, she has been told several things in the past for example a dermatologist told her that she likely had an autoimmune disease because of a rash that was on her arms and legs, she was recently seen by the urgent care 4 days ago and started on prednisone for a feeling of swelling in her legs and swelling in her joints.  She has been on the prednisone for 4 days and tonight while she was sitting in her recliner icing the back of her left knee because of a Baker's cyst she developed a feeling of anxiety, palpitations, she felt flushed in the face and short of breath.  Those symptoms have since improved significantly.  She does not know why she had the symptoms but is increasingly concerned as she has recently lost her insurance, she lost her doctor who died a couple of years ago.  She is not feeling any coughing or fevers, she has not had diarrhea or dysuria.  Home Medications Prior to Admission medications   Medication Sig Start Date End Date Taking? Authorizing Provider  acetaminophen (TYLENOL) 325 MG tablet Take 2 tablets (650 mg total) by mouth every 6 (six) hours as needed for moderate pain. 05/24/21   Jaynee Eagles, PA-C  predniSONE (DELTASONE) 20 MG tablet Take 2 tablets daily with breakfast. 05/24/21   Jaynee Eagles, PA-C  tiZANidine (ZANAFLEX) 4 MG tablet Take 1 tablet (4 mg total) by mouth at bedtime. 05/24/21   Jaynee Eagles, PA-C  estradiol (ESTRACE) 2 MG tablet Take 1 tablet (2 mg total) by mouth daily. Patient not taking: Reported on 05/05/2016 02/27/15 03/28/19  Florian Buff, MD  progesterone (PROMETRIUM) 200 MG capsule Take 1 capsule (200 mg total)  by mouth daily. At bedtime Patient not taking: Reported on 05/05/2016 01/22/15 03/28/19  Florian Buff, MD      Allergies    Ibuprofen and Meperidine hcl    Review of Systems   Review of Systems  Respiratory:  Positive for shortness of breath.   All other systems reviewed and are negative.  Physical Exam Updated Vital Signs BP (!) 141/71    Pulse 86    Temp 98.6 F (37 C)    Resp 18    Ht 1.549 m (5\' 1" )    Wt 70.3 kg    LMP 09/22/2014    SpO2 98%    BMI 29.28 kg/m  Physical Exam Vitals and nursing note reviewed.  Constitutional:      General: She is not in acute distress.    Appearance: She is well-developed.  HENT:     Head: Normocephalic and atraumatic.     Mouth/Throat:     Pharynx: No oropharyngeal exudate.  Eyes:     General: No scleral icterus.       Right eye: No discharge.        Left eye: No discharge.     Conjunctiva/sclera: Conjunctivae normal.     Pupils: Pupils are equal, round, and reactive to light.  Neck:     Thyroid: No thyromegaly.     Vascular: No JVD.  Cardiovascular:  Rate and Rhythm: Normal rate and regular rhythm.     Heart sounds: Normal heart sounds. No murmur heard.   No friction rub. No gallop.     Comments: There is no arrhythmia, there is no ectopy, she has normal pulses, no edema, no JVD Pulmonary:     Effort: Pulmonary effort is normal. No respiratory distress.     Breath sounds: Normal breath sounds. No wheezing or rales.  Chest:     Chest wall: No tenderness.  Abdominal:     General: Bowel sounds are normal. There is no distension.     Palpations: Abdomen is soft. There is no mass.     Tenderness: There is no abdominal tenderness.  Musculoskeletal:        General: No tenderness. Normal range of motion.     Cervical back: Normal range of motion and neck supple.     Right lower leg: No edema.     Left lower leg: No edema.     Comments: Full range of motion of all 4 extremities at the major joints including the knees elbows  shoulders ankles wrist and hands.  There is no obvious edema or restricted range of motion.  There is mild tenderness behind the left knee where she has a Baker's cyst.  Lymphadenopathy:     Cervical: No cervical adenopathy.  Skin:    General: Skin is warm and dry.     Findings: Rash present. No erythema.     Comments: The patient has a scaling serpiginous rash covering her bilateral lower extremities, it is nonpruritic, there is no petechiae or purpura  Neurological:     Mental Status: She is alert.     Coordination: Coordination normal.     Comments: The patient is awake alert and ambulatory, she speaks in full sentences without distress, she has normal cranial nerves III through XII and normal strength in all 4 extremities  Psychiatric:        Behavior: Behavior normal.    ED Results / Procedures / Treatments   Labs (all labs ordered are listed, but only abnormal results are displayed) Labs Reviewed  SEDIMENTATION RATE  C-REACTIVE PROTEIN  COMPREHENSIVE METABOLIC PANEL  CBC WITH DIFFERENTIAL/PLATELET    EKG None  Radiology No results found.  Procedures Procedures    Medications Ordered in ED Medications - No data to display  ED Course/ Medical Decision Making/ A&P                           Medical Decision Making Amount and/or Complexity of Data Reviewed Labs: ordered. Radiology: ordered. ECG/medicine tests: ordered.   Differential: The patient has had some symptoms that tonight that could be consistent with diseases such as autoimmune diseases, this could be more objective such as a problem with electrolytes pneumonia cardiac arrhythmias or it could be related to the prednisone that she has been taking for 4 days which does seem like a likely candidate.  That being said she has had prednisone before without the same symptoms.  Of note she has not been sleeping well the last few days after starting the prednisone.  Doubt pulmonary embolism, there is no swelling of  the legs, no trauma or injury or recent surgery, no travel, she does not use tobacco or estrogen.  The patient does vape, I did give her counseling about using vaping and the potential lung injury that it causes.  She was not amenable to counseling  She has a rash on her skin which is chronic has been there for years and does not seem to be worsening at all.  It is nonpruritic, she is already seen dermatology and they think this is autoimmune.  I would consider a fungal etiology as well however given the nonpruritic nature and the fluctuating of its presence it seems less likely.  The patient has clear heart and lung sounds, her vital signs are unremarkable except for minimal hypertension.  We will check an EKG and a chest x-ray as well as some labs.  As she does not have a family doctor we will also obtain a sed rate and CRP, she may need to have a prednisone taper off of the original course of prednisone.  At the time of change of shift the patient appears well, vital signs are reassuring, I think that she will be stable for discharge with follow-up with a local family doctor, this follow-up list will be put on her follow-up instructions.  Oncoming physician will follow-up results and disposition accordingly        Final Clinical Impression(s) / ED Diagnoses Final diagnoses:  Anxiety  SOB (shortness of breath)  Rash  Palpitations     Noemi Chapel, MD 05/30/21 2256

## 2021-05-31 LAB — CBC WITH DIFFERENTIAL/PLATELET
Abs Immature Granulocytes: 0.04 10*3/uL (ref 0.00–0.07)
Basophils Absolute: 0.1 10*3/uL (ref 0.0–0.1)
Basophils Relative: 1 %
Eosinophils Absolute: 0.1 10*3/uL (ref 0.0–0.5)
Eosinophils Relative: 1 %
HCT: 41 % (ref 36.0–46.0)
Hemoglobin: 14.3 g/dL (ref 12.0–15.0)
Immature Granulocytes: 0 %
Lymphocytes Relative: 41 %
Lymphs Abs: 5.1 10*3/uL — ABNORMAL HIGH (ref 0.7–4.0)
MCH: 30.7 pg (ref 26.0–34.0)
MCHC: 34.9 g/dL (ref 30.0–36.0)
MCV: 88 fL (ref 80.0–100.0)
Monocytes Absolute: 1 10*3/uL (ref 0.1–1.0)
Monocytes Relative: 8 %
Neutro Abs: 6 10*3/uL (ref 1.7–7.7)
Neutrophils Relative %: 49 %
Platelets: 304 10*3/uL (ref 150–400)
RBC: 4.66 MIL/uL (ref 3.87–5.11)
RDW: 12.5 % (ref 11.5–15.5)
WBC: 12.3 10*3/uL — ABNORMAL HIGH (ref 4.0–10.5)
nRBC: 0 % (ref 0.0–0.2)

## 2021-05-31 LAB — TSH: TSH: 4.199 u[IU]/mL (ref 0.350–4.500)

## 2021-05-31 LAB — C-REACTIVE PROTEIN: CRP: 0.7 mg/dL (ref <1.0)

## 2021-05-31 LAB — SEDIMENTATION RATE: Sed Rate: 2 mm/hr (ref 0–22)

## 2021-05-31 MED ORDER — PREDNISONE 20 MG PO TABS: ORAL_TABLET | ORAL | 0 refills | Status: DC

## 2021-07-20 LAB — LAB REPORT - SCANNED: EGFR: 107

## 2022-01-28 ENCOUNTER — Other Ambulatory Visit (HOSPITAL_COMMUNITY): Payer: Self-pay | Admitting: Physician Assistant

## 2022-01-28 ENCOUNTER — Ambulatory Visit (HOSPITAL_COMMUNITY)
Admission: RE | Admit: 2022-01-28 | Discharge: 2022-01-28 | Disposition: A | Discharge status: Home / Self Care | Payer: Self-pay | Source: Ambulatory Visit | Attending: Physician Assistant | Admitting: Physician Assistant

## 2022-01-28 DIAGNOSIS — M419 Scoliosis, unspecified: Secondary | ICD-10-CM

## 2022-03-25 DIAGNOSIS — Z419 Encounter for procedure for purposes other than remedying health state, unspecified: Secondary | ICD-10-CM | POA: Diagnosis not present

## 2022-04-06 ENCOUNTER — Telehealth: Payer: Self-pay

## 2022-04-06 NOTE — Telephone Encounter (Signed)
Telephoned patient at mobile number. Left a voice message with [facility] (scholarship) contact information. 

## 2022-04-25 DIAGNOSIS — Z419 Encounter for procedure for purposes other than remedying health state, unspecified: Secondary | ICD-10-CM | POA: Diagnosis not present

## 2022-04-27 NOTE — Telephone Encounter (Signed)
Attempted to call patient and schedule appointment (mammogram scholarship). Mailbox full and unable to leave a message.

## 2022-05-12 ENCOUNTER — Telehealth: Payer: Self-pay

## 2022-05-12 NOTE — Telephone Encounter (Signed)
Mychart msg sent. AS, CMA 

## 2022-05-26 DIAGNOSIS — Z419 Encounter for procedure for purposes other than remedying health state, unspecified: Secondary | ICD-10-CM | POA: Diagnosis not present

## 2022-06-24 DIAGNOSIS — Z419 Encounter for procedure for purposes other than remedying health state, unspecified: Secondary | ICD-10-CM | POA: Diagnosis not present

## 2022-07-25 DIAGNOSIS — Z419 Encounter for procedure for purposes other than remedying health state, unspecified: Secondary | ICD-10-CM | POA: Diagnosis not present

## 2022-08-24 DIAGNOSIS — Z419 Encounter for procedure for purposes other than remedying health state, unspecified: Secondary | ICD-10-CM | POA: Diagnosis not present

## 2022-09-24 DIAGNOSIS — Z419 Encounter for procedure for purposes other than remedying health state, unspecified: Secondary | ICD-10-CM | POA: Diagnosis not present

## 2022-10-24 DIAGNOSIS — Z419 Encounter for procedure for purposes other than remedying health state, unspecified: Secondary | ICD-10-CM | POA: Diagnosis not present

## 2022-11-24 DIAGNOSIS — Z419 Encounter for procedure for purposes other than remedying health state, unspecified: Secondary | ICD-10-CM | POA: Diagnosis not present

## 2022-12-25 DIAGNOSIS — Z419 Encounter for procedure for purposes other than remedying health state, unspecified: Secondary | ICD-10-CM | POA: Diagnosis not present

## 2022-12-28 ENCOUNTER — Telehealth: Payer: Self-pay

## 2022-12-28 NOTE — Telephone Encounter (Signed)
Wellness call completed to [facility] client.

## 2022-12-28 NOTE — Telephone Encounter (Signed)
Wellness call completed to [facility] client. She is current with [facility] as her PCP and her next appointments are in December. Reviewed some of [facility] services such as food market, assistance applying for Medicaid and other resources as needed.  Discussed hours of market and [facility] and food market hours. At client's request sent text message of [facility] office address. She will come utilize the food market and at that time inquire about documents needed and an appointment to apply for Medicaid..  She is able to get her medications as needed. Housing is stable and safe, lives currently with parents.  No other needs at this time.  [name] Nodal RN [employer]

## 2023-02-09 ENCOUNTER — Ambulatory Visit: Payer: Medicaid Other | Admitting: Family Medicine

## 2023-02-09 ENCOUNTER — Other Ambulatory Visit: Payer: Self-pay

## 2023-02-09 ENCOUNTER — Ambulatory Visit (INDEPENDENT_AMBULATORY_CARE_PROVIDER_SITE_OTHER): Payer: Medicaid Other | Admitting: Family Medicine

## 2023-02-09 VITALS — BP 120/88 | HR 107 | Temp 98.8°F | Ht 61.0 in | Wt 173.0 lb

## 2023-02-09 DIAGNOSIS — Z1159 Encounter for screening for other viral diseases: Secondary | ICD-10-CM | POA: Diagnosis not present

## 2023-02-09 DIAGNOSIS — F339 Major depressive disorder, recurrent, unspecified: Secondary | ICD-10-CM | POA: Diagnosis not present

## 2023-02-09 DIAGNOSIS — Z114 Encounter for screening for human immunodeficiency virus [HIV]: Secondary | ICD-10-CM

## 2023-02-09 DIAGNOSIS — M419 Scoliosis, unspecified: Secondary | ICD-10-CM | POA: Diagnosis not present

## 2023-02-09 DIAGNOSIS — Z1231 Encounter for screening mammogram for malignant neoplasm of breast: Secondary | ICD-10-CM

## 2023-02-09 DIAGNOSIS — Z0000 Encounter for general adult medical examination without abnormal findings: Secondary | ICD-10-CM | POA: Diagnosis not present

## 2023-02-09 DIAGNOSIS — Z131 Encounter for screening for diabetes mellitus: Secondary | ICD-10-CM | POA: Diagnosis not present

## 2023-02-09 DIAGNOSIS — L4050 Arthropathic psoriasis, unspecified: Secondary | ICD-10-CM

## 2023-02-09 DIAGNOSIS — Z1329 Encounter for screening for other suspected endocrine disorder: Secondary | ICD-10-CM

## 2023-02-09 DIAGNOSIS — Z23 Encounter for immunization: Secondary | ICD-10-CM

## 2023-02-09 DIAGNOSIS — E66811 Obesity, class 1: Secondary | ICD-10-CM

## 2023-02-09 DIAGNOSIS — E559 Vitamin D deficiency, unspecified: Secondary | ICD-10-CM | POA: Diagnosis not present

## 2023-02-09 DIAGNOSIS — Z1211 Encounter for screening for malignant neoplasm of colon: Secondary | ICD-10-CM

## 2023-02-09 MED ORDER — BUSPIRONE HCL 7.5 MG PO TABS: 7.5000 mg | ORAL_TABLET | Freq: Two times a day (BID) | ORAL | 1 refills | Status: DC

## 2023-02-09 MED ORDER — MOMETASONE FUROATE 0.1 % EX CREA: TOPICAL_CREAM | CUTANEOUS | 2 refills | Status: DC

## 2023-02-09 NOTE — Assessment & Plan Note (Signed)
Uncontrolled. Continue Cymbalta 60mg  daily. Start Buspar 7.5mg  BID. Denies SI/HI.     No data to display

## 2023-02-09 NOTE — Assessment & Plan Note (Signed)
Chronic uncontrolled. Start Elocon 0.1% cream BID to skin plaques. Will refer to Rheum.

## 2023-02-09 NOTE — Progress Notes (Signed)
New Patient Office Visit  Subjective    Patient ID: [name] [name], female    DOB: [birth year]  Age: 52 y.o. MRN: [id number]  CC:  Chief Complaint  Patient presents with   Establish Care    HPI [name] [name] presents to establish care. Oriented to practice routines and expectations. PMH includes depression, psoriatic arthritis, DDD, and scoliosis. Has been being seen at [facility] department for routine health care.  Breast CA screening: Mammogram status: Completed never. Repeat every year Cervical CA screening:  not indicated, hysterectomy Colon CA screening: cologard Tobacco: vapes STI: declines Vaccines:  UTD    Outpatient Encounter Medications as of 02/09/2023  Medication Sig   busPIRone (BUSPAR) 7.5 MG tablet Take 1 tablet (7.5 mg total) by mouth 2 (two) times daily.   Cholecalciferol (VITAMIN D-3) 125 MCG (5000 UT) TABS Take by mouth.   DULoxetine (CYMBALTA) 60 MG capsule Take 60 mg by mouth daily. Per pt only take 1 before bedtime   meloxicam (MOBIC) 7.5 MG tablet Take 7.5 mg by mouth in the morning and at bedtime.   mometasone (ELOCON) 0.1 % cream Apply to skin lesions twice daily   [DISCONTINUED] acetaminophen (TYLENOL) 325 MG tablet Take 2 tablets (650 mg total) by mouth every 6 (six) hours as needed for moderate pain.   [DISCONTINUED] estradiol (ESTRACE) 2 MG tablet Take 1 tablet (2 mg total) by mouth daily. (Patient not taking: Reported on 05/05/2016)   [DISCONTINUED] predniSONE (DELTASONE) 20 MG tablet 3 tabs po daily x 3 days, then 2 tabs x 3 days, then 1.5 tabs x 3 days, then 1 tab x 3 days, then 0.5 tabs x 3 days   [DISCONTINUED] progesterone (PROMETRIUM) 200 MG capsule Take 1 capsule (200 mg total) by mouth daily. At bedtime (Patient not taking: Reported on 05/05/2016)   [DISCONTINUED] tiZANidine (ZANAFLEX) 4 MG tablet Take 1 tablet (4 mg total) by mouth at bedtime.   No facility-administered encounter medications on file as of 02/09/2023.    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    Endometriosis    Fibroid uterus    Scoliosis     Past Surgical History:  Procedure Laterality Date   ABDOMINAL HYSTERECTOMY N/A 01/21/2015   Procedure: HYSTERECTOMY ABDOMINAL WITH IVP;  Surgeon: Lazaro Arms, MD;  Location: [facility];  Service: Gynecology;  Laterality: N/A;   SALPINGOOPHORECTOMY Bilateral 01/21/2015   Procedure: BILATERAL SALPINGO OOPHORECTOMY;  Surgeon: Lazaro Arms, MD;  Location: [facility];  Service: Gynecology;  Laterality: Bilateral;    Family History  Problem Relation Age of Onset   Hypertension Mother    Other Mother        lumpectomy   Heart disease Mother    Other Sister        lumpectomy   Heart disease Sister     Social History   Socioeconomic History   Marital status: Single    Spouse name: Not on file   Number of children: Not on file   Years of education: Not on file   Highest education level: Some college, no degree  Occupational History   Not on file  Tobacco Use   Smoking status: Former    Current packs/day: 0.00    Average packs/day: 0.5 packs/day for 15.0 years (7.5 ttl pk-yrs)    Types: Cigarettes    Start date: 02/24/2004    Quit  date: 02/24/2019    Years since quitting: 3.9   Smokeless tobacco: Current  Substance and Sexual Activity   Alcohol use: Yes    Comment: rarely   Drug use: No   Sexual activity: Yes  Other Topics Concern   Not on file  Social History Narrative   Not on file   Social Determinants of Health   Financial Resource Strain: High Risk (02/09/2023)   Overall Financial Resource Strain (CARDIA)    Difficulty of Paying Living Expenses: Hard  Food Insecurity: No Food Insecurity (02/09/2023)   Hunger Vital Sign    Worried About Running Out of Food in the Last Year: Never true    Ran Out of Food in the Last Year: Never true  Transportation Needs: No Transportation Needs (02/09/2023)    PRAPARE - Administrator, Civil Service (Medical): No    Lack of Transportation (Non-Medical): No  Physical Activity: Sufficiently Active (02/09/2023)   Exercise Vital Sign    Days of Exercise per Week: 5 days    Minutes of Exercise per Session: 40 min  Stress: No Stress Concern Present (02/09/2023)   [employer] of Occupational Health - Occupational Stress Questionnaire    Feeling of Stress : Only a little  Social Connections: Moderately Isolated (02/09/2023)   Social Connection and Isolation Panel [NHANES]    Frequency of Communication with Friends and Family: More than three times a week    Frequency of Social Gatherings with Friends and Family: More than three times a week    Attends Religious Services: More than 4 times per year    Active Member of [employer] or Organizations: No    Attends Engineer, structural: Not on file    Marital Status: Divorced  Intimate Partner Violence: Not on file    Review of Systems  Constitutional: Negative.   HENT: Negative.    Eyes: Negative.   Respiratory: Negative.    Cardiovascular: Negative.   Gastrointestinal: Negative.   Genitourinary: Negative.   Musculoskeletal: Negative.   Skin:  Positive for rash. Negative for itching.  Neurological: Negative.   Endo/Heme/Allergies: Negative.   Psychiatric/Behavioral:  Positive for depression.   All other systems reviewed and are negative.       Objective    BP 120/88   Pulse (!) 107   Temp 98.8 F (37.1 C) (Oral)   Ht 5\' 1"  (1.549 m)   Wt 173 lb (78.5 kg)   LMP 09/22/2014   SpO2 98%   BMI 32.69 kg/m   Physical Exam Vitals and nursing note reviewed.  Constitutional:      Appearance: Normal appearance. She is obese.  HENT:     Head: Normocephalic and atraumatic.     Right Ear: Tympanic membrane, ear canal and external ear normal.     Left Ear: Tympanic membrane, ear canal and external ear normal.     Nose: Nose normal.     Mouth/Throat:     Mouth: Mucous  membranes are moist.     Pharynx: Oropharynx is clear.  Eyes:     Extraocular Movements: Extraocular movements intact.     Conjunctiva/sclera: Conjunctivae normal.     Pupils: Pupils are equal, round, and reactive to light.  Cardiovascular:     Rate and Rhythm: Normal rate and regular rhythm.     Pulses: Normal pulses.     Heart sounds: Normal heart sounds.  Pulmonary:     Effort: Pulmonary effort is normal.     Breath sounds:  Normal breath sounds.  Abdominal:     General: Bowel sounds are normal.     Palpations: Abdomen is soft.  Musculoskeletal:        General: Normal range of motion.     Cervical back: Normal range of motion and neck supple.  Skin:    General: Skin is warm and dry.     Capillary Refill: Capillary refill takes less than 2 seconds.     Findings: Rash present. Rash is scaling.     Comments: Scaling plaques to generalized bilateral upper and lower extremities and bilateral cheeks  Neurological:     General: No focal deficit present.     Mental Status: She is alert and oriented to person, place, and time. Mental status is at baseline.  Psychiatric:        Mood and Affect: Mood normal.        Behavior: Behavior normal.        Thought Content: Thought content normal.        Judgment: Judgment normal.         Assessment & Plan:   Problem List Items Addressed This Visit     Scoliosis    Chronic with pain. Would like referral to Orthopedics for treatment options. Has tried PT and injections in past.       Relevant Orders   Ambulatory referral to Orthopedic Surgery   Physical exam, annual - Primary    Today your medical history was reviewed and routine physical exam with labs was performed. Recommend 150 minutes of moderate intensity exercise weekly and consuming a well-balanced diet. Advised to stop smoking if a smoker, avoid smoking if a non-smoker, limit alcohol consumption to 1 drink per day for women and 2 drinks per day for men, and avoid illicit drug  use. Counseled on safe sex practices and offered STI testing today. Counseled on the importance of sunscreen use. Counseled in mental health awareness and when to seek medical care. Vaccine maintenance discussed. Appropriate health maintenance items reviewed. Return to office in 1 year for annual physical exam.       Relevant Orders   CBC with Differential/Platelet   COMPLETE METABOLIC PANEL WITH GFR   Lipid panel   Hemoglobin A1c   TSH   Obesity (BMI 30.0-34.9)   Psoriatic arthritis (HCC)    Chronic uncontrolled. Start Elocon 0.1% cream BID to skin plaques. Will refer to Rheum.      Relevant Medications   meloxicam (MOBIC) 7.5 MG tablet   Other Relevant Orders   Ambulatory referral to Rheumatology   Depression, recurrent (HCC)    Uncontrolled. Continue Cymbalta 60mg  daily. Start Buspar 7.5mg  BID. Denies SI/HI.     No data to display               Relevant Medications   DULoxetine (CYMBALTA) 60 MG capsule   busPIRone (BUSPAR) 7.5 MG tablet   Other Visit Diagnoses     Colon cancer screening       Relevant Orders   Cologuard   Encounter for screening mammogram for malignant neoplasm of breast       Relevant Orders   MM DIGITAL SCREENING BILATERAL   Screening for HIV (human immunodeficiency virus)       Relevant Orders   HIV Antibody (routine testing w rflx)   Need for hepatitis C screening test       Relevant Orders   Hepatitis C antibody   Vitamin D deficiency  Relevant Orders   VITAMIN D 25 Hydroxy (Vit-D Deficiency, Fractures)   Screening for thyroid disorder       Relevant Orders   TSH   Screening for diabetes mellitus       Relevant Orders   Hemoglobin A1c       Return in about 6 weeks (around 03/23/2023) for anxiety/depression.   Park Meo, FNP

## 2023-02-09 NOTE — Assessment & Plan Note (Signed)
Chronic with pain. Would like referral to Orthopedics for treatment options. Has tried PT and injections in past.

## 2023-02-09 NOTE — Assessment & Plan Note (Signed)

## 2023-02-10 LAB — CBC WITH DIFFERENTIAL/PLATELET
Absolute Lymphocytes: 2781 {cells}/uL (ref 850–3900)
Absolute Monocytes: 695 {cells}/uL (ref 200–950)
Basophils Absolute: 40 {cells}/uL (ref 0–200)
Basophils Relative: 0.5 %
Eosinophils Absolute: 79 {cells}/uL (ref 15–500)
Eosinophils Relative: 1 %
HCT: 40.9 % (ref 35.0–45.0)
Hemoglobin: 13.7 g/dL (ref 11.7–15.5)
MCH: 29.3 pg (ref 27.0–33.0)
MCHC: 33.5 g/dL (ref 32.0–36.0)
MCV: 87.4 fL (ref 80.0–100.0)
MPV: 10 fL (ref 7.5–12.5)
Monocytes Relative: 8.8 %
Neutro Abs: 4306 {cells}/uL (ref 1500–7800)
Neutrophils Relative %: 54.5 %
Platelets: 293 10*3/uL (ref 140–400)
RBC: 4.68 10*6/uL (ref 3.80–5.10)
RDW: 12.7 % (ref 11.0–15.0)
Total Lymphocyte: 35.2 %
WBC: 7.9 10*3/uL (ref 3.8–10.8)

## 2023-02-10 LAB — COMPLETE METABOLIC PANEL WITH GFR
AG Ratio: 1.8 (calc) (ref 1.0–2.5)
ALT: 18 U/L (ref 6–29)
AST: 18 U/L (ref 10–35)
Albumin: 4.7 g/dL (ref 3.6–5.1)
Alkaline phosphatase (APISO): 98 U/L (ref 37–153)
BUN: 10 mg/dL (ref 7–25)
CO2: 29 mmol/L (ref 20–32)
Calcium: 10 mg/dL (ref 8.6–10.4)
Chloride: 104 mmol/L (ref 98–110)
Creat: 0.8 mg/dL (ref 0.50–1.03)
Globulin: 2.6 g/dL (ref 1.9–3.7)
Glucose, Bld: 82 mg/dL (ref 65–99)
Potassium: 4.1 mmol/L (ref 3.5–5.3)
Sodium: 141 mmol/L (ref 135–146)
Total Bilirubin: 0.5 mg/dL (ref 0.2–1.2)
Total Protein: 7.3 g/dL (ref 6.1–8.1)
eGFR: 89 mL/min/{1.73_m2} (ref >=60)

## 2023-02-10 LAB — TSH: TSH: 1.62 m[IU]/L

## 2023-02-10 LAB — LIPID PANEL
Cholesterol: 247 mg/dL — ABNORMAL HIGH (ref <200)
HDL: 40 mg/dL — ABNORMAL LOW (ref >=50)
LDL Cholesterol (Calc): 172 mg/dL — ABNORMAL HIGH
Non-HDL Cholesterol (Calc): 207 mg/dL — ABNORMAL HIGH (ref <130)
Total CHOL/HDL Ratio: 6.2 (calc) — ABNORMAL HIGH (ref <5.0)
Triglycerides: 191 mg/dL — ABNORMAL HIGH (ref <150)

## 2023-02-10 LAB — HEMOGLOBIN A1C
Hgb A1c MFr Bld: 5.1 %{Hb} (ref <5.7)
Mean Plasma Glucose: 100 mg/dL
eAG (mmol/L): 5.5 mmol/L

## 2023-02-10 LAB — HIV ANTIBODY (ROUTINE TESTING W REFLEX): HIV 1&2 Ab, 4th Generation: NONREACTIVE

## 2023-02-10 LAB — VITAMIN D 25 HYDROXY (VIT D DEFICIENCY, FRACTURES): Vit D, 25-Hydroxy: 62 ng/mL (ref 30–100)

## 2023-02-10 LAB — HEPATITIS C ANTIBODY: Hepatitis C Ab: NONREACTIVE

## 2023-02-22 ENCOUNTER — Ambulatory Visit: Payer: Medicaid Other | Admitting: Orthopaedic Surgery

## 2023-02-24 DIAGNOSIS — Z419 Encounter for procedure for purposes other than remedying health state, unspecified: Secondary | ICD-10-CM | POA: Diagnosis not present

## 2023-03-01 ENCOUNTER — Encounter: Payer: Self-pay | Admitting: Orthopaedic Surgery

## 2023-03-01 ENCOUNTER — Ambulatory Visit (INDEPENDENT_AMBULATORY_CARE_PROVIDER_SITE_OTHER): Payer: Medicaid Other | Admitting: Orthopaedic Surgery

## 2023-03-01 VITALS — Ht 61.0 in | Wt 173.0 lb

## 2023-03-01 DIAGNOSIS — M4125 Other idiopathic scoliosis, thoracolumbar region: Secondary | ICD-10-CM

## 2023-03-01 NOTE — Addendum Note (Signed)
Addended by: Rogers Seeds on: 03/01/2023 10:56 AM   Modules accepted: Orders

## 2023-03-01 NOTE — Progress Notes (Signed)
Office Visit Note   Patient: [name] [name]           Date of Birth: 1970/08/30           MRN: [id number] Visit Date: 03/01/2023              Requested by: Park Meo, FNP [address] [place],  [place] [address] PCP: Park Meo, FNP   Assessment & Plan: Visit Diagnoses:  1. Other idiopathic scoliosis, thoracolumbar region     Plan: Will set up for some treatment with core strengthening stretching modalities see if we can get her some relief she is having problems working and likely has some facet arthropathy in the lumbar spine.  She does not have claudication symptoms.  Recheck 7 weeks.  Follow-Up Instructions: No follow-ups on file.   Orders:  No orders of the defined types were placed in this encounter.  No orders of the defined types were placed in this encounter.     Procedures: No procedures performed   Clinical Data: No additional findings.   Subjective: Chief Complaint  Patient presents with   Scoliosis    HPI 52 year old female working part-time at [employer] has had problems with scoliosis.  She was brace from age [age] had progressive curvature she has an S shaped curve listed below with 40 degree curvature of thoracic 20 degree lumbar spine measured by radiologist last year listed below.  She has pain left lumbar region mid lumbar paraspinals where there of her curvature is it radiates into her buttocks does not radiate down to her legs.  She has problems with standing she has tried over-the-counter medications as well as bracing without improvement.  She denies claudication symptoms she does have a history of psoriatic arthritis.  Review of Systems past problems with depression.  Total hysterectomy in the past vitamin D deficiency.   Objective: Vital Signs: Ht 5\' 1"  (1.549 m)   Wt 173 lb (78.5 kg)   LMP 09/22/2014   BMI 32.69 kg/m   Physical Exam Constitutional:      Appearance: She is well-developed.  HENT:     Head: Normocephalic.      Right Ear: External ear normal.     Left Ear: External ear normal. There is no impacted cerumen.  Eyes:     Pupils: Pupils are equal, round, and reactive to light.  Neck:     Thyroid: No thyromegaly.     Trachea: No tracheal deviation.  Cardiovascular:     Rate and Rhythm: Normal rate.  Pulmonary:     Effort: Pulmonary effort is normal.  Abdominal:     Palpations: Abdomen is soft.  Musculoskeletal:     Cervical back: No rigidity.  Skin:    General: Skin is warm and dry.  Neurological:     Mental Status: She is alert and oriented to person, place, and time.  Psychiatric:        Behavior: Behavior normal.     Ortho Exam scoliosis left lumbar curvature right thoracic.  Tenderness over the left lumbar region no sciatic notch tenderness no trochanteric bursal tenderness negative logroll hips mild pelvic obliquity.  Reflexes are 2+ she is able to heel walk has some trouble with balance but has good strength dorsiflexion plantarflexion against resistance easier toe walking and heel walking.  Quads are strong no hip flexion weakness.  Abductors and abductors are strong.  Specialty Comments:  No specialty comments available.  Imaging: Narrative & Impression  CLINICAL DATA:  Scoliosis.   EXAM: DG SCOLIOSIS EVAL COMPLETE SPINE 1V   COMPARISON:  Lumbar radiograph 05/26/2015   FINDINGS: Moderate S shaped scoliotic curvature. Twelve rib-bearing thoracic vertebra and 5 non-rib-bearing lumbar vertebra. 40 degrees dextroscoliotic curvature of the thoracic spine when measured from superior endplate of superior endplate of T4 to inferior endplate of T11. 29 degrees levo scoliotic curvature of the lumbar spine when measured from superior endplate of T12 to inferior endplate of L4. No evidence of intrinsic vertebral abnormality on this wide field of view. No pelvic tilt.   IMPRESSION: Moderate S-shaped scoliotic curvature of the thoracolumbar spine, 40 degrees in the thoracic spine and  20 degrees in the lumbar spine. Lumbar scoliotic curvature has increased from 2017 exam.     Electronically Signed   By: Narda Rutherford M.D.   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    Endometriosis    Fibroid uterus    Psoriasis    Scoliosis     Family History  Problem Relation Age of Onset   Hypertension Mother    Other Mother        lumpectomy   Heart disease Mother    Other Sister        lumpectomy   Heart disease Sister     Past Surgical History:  Procedure Laterality Date   ABDOMINAL HYSTERECTOMY N/A 01/21/2015   Procedure: HYSTERECTOMY ABDOMINAL WITH IVP;  Surgeon: Lazaro Arms, MD;  Location: [facility];  Service: Gynecology;  Laterality: N/A;   SALPINGOOPHORECTOMY Bilateral 01/21/2015   Procedure: BILATERAL SALPINGO OOPHORECTOMY;  Surgeon: Lazaro Arms, MD;  Location: [facility];  Service: Gynecology;  Laterality: Bilateral;   Social History   Occupational History   Not on file  Tobacco Use   Smoking status: Former    Current packs/day: 0.00    Average packs/day: 0.5 packs/day for 15.0 years (7.5 ttl pk-yrs)    Types: Cigarettes    Start date: 02/24/2004    Quit date: 02/24/2019    Years since quitting: 4.0   Smokeless tobacco: Current  Substance and Sexual Activity   Alcohol use: Yes    Comment: rarely   Drug use: No   Sexual activity: Yes

## 2023-03-06 ENCOUNTER — Ambulatory Visit: Payer: Medicaid Other

## 2023-03-10 ENCOUNTER — Ambulatory Visit (HOSPITAL_COMMUNITY): Payer: Medicaid Other | Attending: Orthopaedic Surgery

## 2023-03-10 DIAGNOSIS — M6281 Muscle weakness (generalized): Secondary | ICD-10-CM | POA: Insufficient documentation

## 2023-03-10 DIAGNOSIS — M5459 Other low back pain: Secondary | ICD-10-CM | POA: Diagnosis not present

## 2023-03-10 DIAGNOSIS — M4125 Other idiopathic scoliosis, thoracolumbar region: Secondary | ICD-10-CM | POA: Diagnosis not present

## 2023-03-10 NOTE — Therapy (Signed)
[name] OUTPATIENT PHYSICAL THERAPY EVALUATION (THORACOLUMBAR)   Patient Name: [name] [name] MRN: [id number] DOB:1970-10-31, 52 y.o., female Today's Date: 03/10/2023  END OF SESSION:   PT End of Session - 03/10/23 1401     Visit Number 1    Number of Visits 10    Authorization Type [employer]    Authorization Time Period auth requested 11/15    Progress Note Due on Visit 10    PT Start Time 0800    PT Stop Time 0840    PT Time Calculation (min) 40 min    Activity Tolerance Patient tolerated treatment well    Behavior During Therapy WFL for tasks assessed/performed              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    Endometriosis    Fibroid uterus    Psoriasis    Scoliosis    Past Surgical History:  Procedure Laterality Date   ABDOMINAL HYSTERECTOMY N/A 01/21/2015   Procedure: HYSTERECTOMY ABDOMINAL WITH IVP;  Surgeon: Lazaro Arms, MD;  Location: [facility];  Service: Gynecology;  Laterality: N/A;   SALPINGOOPHORECTOMY Bilateral 01/21/2015   Procedure: BILATERAL SALPINGO OOPHORECTOMY;  Surgeon: Lazaro Arms, MD;  Location: [facility];  Service: Gynecology;  Laterality: Bilateral;   Patient Active Problem List   Diagnosis Date Noted   Scoliosis 02/09/2023   Physical exam, annual 02/09/2023   Obesity (BMI 30.0-34.9) 02/09/2023   Psoriatic arthritis (HCC) 02/09/2023   Depression, recurrent (HCC) 02/09/2023   Vitamin D deficiency 02/09/2023   S/P total hysterectomy and bilateral salpingo-oophorectomy 01/21/2015   DEPRESSION 12/15/2006    PCP: Park Meo, FNPPCP - General   REFERRING PROVIDER: Eldred Manges, MD   REFERRING DIAG:  Diagnosis  M41.25 (ICD-10-CM) - Other idiopathic scoliosis, thoracolumbar region    Rationale for Evaluation and Treatment: Rehabilitation  THERAPY DIAG:  Other idiopathic scoliosis, thoracolumbar region  Muscle weakness  (generalized)  Other low back pain  ONSET DATE: chronic scoliosis  --------------------------------------------------------------------------------------------- SUBJECTIVE:                                                                                                                                                                                           SUBJECTIVE STATEMENT:   Patient has had chronic scoliosis daily pain for year. Patient was able to get insurance and saw MD last week. MD referred to OPPT   PERTINENT HISTORY:  DDD Psoriatic Arthritis   PAIN:  Are you having pain? Yes: NPRS scale: 4/10 Pain location:  lumbar Pain description: sharp, constant Aggravating factors: standing Relieving factors: rest  PRECAUTIONS: None  RED FLAGS: None   WEIGHT BEARING RESTRICTIONS: No  FALLS:  Has patient fallen in last 6 months? Yes. Number of falls 3; patient was on Gabapentin Spring 2024  LIVING ENVIRONMENT: Lives with: lives with their family Lives in: House/apartment Stairs: Yes: Internal: 13 steps; can reach both Has following equipment at home: Grab bars How many hours do you sleep every night: 8 How many minutes of moderate exercise do you get weekly: 60 minutes  OCCUPATION: Pantry- on feet , bending, lifting   PLOF: Independent   PATIENT GOALS: to decrease pain   NEXT MD VISIT: 04/20/23 --------------------------------------------------------------------------------------------- OBJECTIVE:   DIAGNOSTIC FINDINGS:  IMPRESSION: Moderate S-shaped scoliotic curvature of the thoracolumbar spine, 40 degrees in the thoracic spine and 20 degrees in the lumbar spine. Lumbar scoliotic curvature has increased from 2017 exam  PATIENT SURVEYS:  Back index: Oswestry Score: 22 / 50 or 44 %  SCREENING FOR RED FLAGS: Bowel or bladder incontinence: No Spinal tumors: No Cauda equina syndrome: No Compression fracture: No Abdominal aneurysm: No  COGNITION: Overall  cognitive status: Within functional limits for tasks assessed  POSTURE: forward head, increased thoracic kyphosis, and weight shift left        GAIT ANALYSIS: Distance walked: 25 feet Assistive device utilized: None Level of assistance: Complete Independence Comments: Patient's gait grossly WFL; minimal antalgia  SENSATION: WFL   LUMBAR ROM:   AROM eval  Flexion Mid shin  Extension 0-5  Right lateral flexion Mid thigh  Left lateral flexion Mid thigh  Right rotation   Left rotation    (Blank rows = not tested; * = limited by pain)  LOWER EXTREMITY MMT:    Left ankle/hip/knee grossly 4-/5  Right ankle/hip/knee grossly 4-/5   LOWER EXTREMITY ROM:     Left ankle/hip/knee AROM grossly WFL  Right ankle/hip/knee AROM grossly WFL  PALPATION: Moderate tenderness to palpation/ muscle guarding lumbar paraspinals  --------------------------------------------------------------------------------------------- TODAY'S TREATMENT:                                                                                                                              DATE:   03/10/23 PT Initial Eval   PATIENT EDUCATION:  Education details: activity modification, plan of care  Person educated: Patient Education method: Explanation Education comprehension: verbalized understanding  HOME EXERCISE PROGRAM: TBD  --------------------------------------------------------------------------------------------- ASSESSMENT:  CLINICAL IMPRESSION: Patient is a 52 y.o. y.o. female who was seen today for physical therapy evaluation and treatment for low back pain and muscle weakness secondary to scoliosis. Patient presents to PT with the following objective impairments: decreased endurance, decreased ROM, decreased strength, impaired flexibility, and postural dysfunction. These impairments limit the patient in activities such as carrying, lifting, bending, standing, squatting, stairs, bed mobility, and  caring for others. These impairments also limit the patient in participation such as meal prep, cleaning, laundry, medication management, driving, shopping, community activity, and  yard work. The patient will benefit from PT to address the limitations/impairments listed below to return to their prior level of function in the domains of activity and participation.    PERSONAL FACTORS: Time since onset of injury/illness/exacerbation are also affecting patient's functional outcome.   REHAB POTENTIAL: Fair    CLINICAL DECISION MAKING: Stable/uncomplicated  EVALUATION COMPLEXITY: Low  --------------------------------------------------------------------------------------------- GOALS: Goals reviewed with patient? No  SHORT TERM GOALS: Target date: 03/31/2023    Patient will be able to score a </= 40% on the  Oswestry  to demonstrate an improvement in overall housework, ADL completion, mobility, and self-care. Baseline: Goal status: INITIAL  2. Patient will be independent with a basic stretching/strengthening HEP  Baseline:  Goal status: INITIAL   LONG TERM GOALS: Target date: 04/21/2023    Patient will be able to score a </= 35 on the  Oswestry  to demonstrate an improvement in overall housework, ADL completion, mobility, and self-care.Baseline:  Goal status: INITIAL  2.   Patient will able to lift 10 pounds overhead with both arms 5 times with 1-2/10 pain to faciliate pain free participation at work. Baseline:  Goal status: INITIAL  3.  Patient will be independent with a comprehensive strengthening HEP  Baseline:  Goal status: INITIAL  --------------------------------------------------------------------------------------------- PLAN:  PT FREQUENCY: 1-2x/week  PT DURATION: 6 weeks  PLANNED INTERVENTIONS: 97110-Therapeutic exercises, 97530- Therapeutic activity, 97112- Neuromuscular re-education, 97535- Self Care, [id number]- Manual therapy, [phone]- Gait training, Balance  training, Stair training, Taping, Dry Needling, Joint mobilization, Joint manipulation, Spinal manipulation, Spinal mobilization, Cryotherapy, and Moist heat.  PLAN FOR NEXT SESSION: Progress lumbar stability   Seymour Bars, PT 03/10/2023, 2:03 PM

## 2023-03-13 ENCOUNTER — Encounter (HOSPITAL_COMMUNITY): Payer: Medicaid Other

## 2023-03-17 ENCOUNTER — Ambulatory Visit (INDEPENDENT_AMBULATORY_CARE_PROVIDER_SITE_OTHER): Payer: Medicaid Other

## 2023-03-17 DIAGNOSIS — M6281 Muscle weakness (generalized): Secondary | ICD-10-CM | POA: Diagnosis not present

## 2023-03-17 DIAGNOSIS — M5459 Other low back pain: Secondary | ICD-10-CM | POA: Diagnosis not present

## 2023-03-17 DIAGNOSIS — M4125 Other idiopathic scoliosis, thoracolumbar region: Secondary | ICD-10-CM

## 2023-03-17 NOTE — Therapy (Signed)
OUTPATIENT PHYSICAL THERAPY EVALUATION (THORACOLUMBAR)   Patient Name: [name] [name] MRN: [id number] DOB:12/26/1970, 52 y.o., female Today's Date: 03/17/2023  END OF SESSION:   PT End of Session - 03/17/23 1430     Visit Number 2    Number of Visits 10    Authorization Type [employer]    Authorization Time Period auth requested 11/15    Progress Note Due on Visit 10    PT Start Time 0230    PT Stop Time 0310    PT Time Calculation (min) 40 min    Activity Tolerance Patient tolerated treatment well    Behavior During Therapy WFL for tasks assessed/performed              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    Endometriosis    Fibroid uterus    Psoriasis    Scoliosis    Past Surgical History:  Procedure Laterality Date   ABDOMINAL HYSTERECTOMY N/A 01/21/2015   Procedure: HYSTERECTOMY ABDOMINAL WITH IVP;  Surgeon: Lazaro Arms, MD;  Location: [facility];  Service: Gynecology;  Laterality: N/A;   SALPINGOOPHORECTOMY Bilateral 01/21/2015   Procedure: BILATERAL SALPINGO OOPHORECTOMY;  Surgeon: Lazaro Arms, MD;  Location: [facility];  Service: Gynecology;  Laterality: Bilateral;   Patient Active Problem List   Diagnosis Date Noted   Scoliosis 02/09/2023   Physical exam, annual 02/09/2023   Obesity (BMI 30.0-34.9) 02/09/2023   Psoriatic arthritis (HCC) 02/09/2023   Depression, recurrent (HCC) 02/09/2023   Vitamin D deficiency 02/09/2023   S/P total hysterectomy and bilateral salpingo-oophorectomy 01/21/2015   DEPRESSION 12/15/2006    PCP: Park Meo, [facility] - General   REFERRING PROVIDER: Eldred Manges, MD   REFERRING DIAG:  Diagnosis  M41.25 (ICD-10-CM) - Other idiopathic scoliosis, thoracolumbar region    Rationale for Evaluation and Treatment: Rehabilitation  THERAPY DIAG:  Other idiopathic scoliosis, thoracolumbar region  Muscle weakness  (generalized)  Other low back pain  ONSET DATE: chronic scoliosis  --------------------------------------------------------------------------------------------- SUBJECTIVE:                                                                                                                                                                                           SUBJECTIVE STATEMENT:  Today: Patient with 7/10 back pain today in left side after work   Patient has had chronic scoliosis daily pain for year. Patient was able to get insurance and saw MD last week. MD referred to OPPT   PERTINENT HISTORY:  DDD Psoriatic Arthritis  PAIN:  Are you having pain? Yes: NPRS scale: 4/10 Pain location: lumbar Pain description: sharp, constant Aggravating factors: standing Relieving factors: rest  PRECAUTIONS: None  RED FLAGS: None   WEIGHT BEARING RESTRICTIONS: No  FALLS:  Has patient fallen in last 6 months? Yes. Number of falls 3; patient was on Gabapentin Spring 2024  LIVING ENVIRONMENT: Lives with: lives with their family Lives in: House/apartment Stairs: Yes: Internal: 13 steps; can reach both Has following equipment at home: Grab bars How many hours do you sleep every night: 8 How many minutes of moderate exercise do you get weekly: 60 minutes  OCCUPATION: [employer]ry- on feet , bending, lifting   PLOF: Independent   PATIENT GOALS: to decrease pain   NEXT MD VISIT: 04/20/23 --------------------------------------------------------------------------------------------- OBJECTIVE:   DIAGNOSTIC FINDINGS:  IMPRESSION: Moderate S-shaped scoliotic curvature of the thoracolumbar spine, 40 degrees in the thoracic spine and 20 degrees in the lumbar spine. Lumbar scoliotic curvature has increased from 2017 exam  PATIENT SURVEYS:  Back index: Oswestry Score: 22 / 50 or 44 %  SCREENING FOR RED FLAGS: Bowel or bladder incontinence: No Spinal tumors: No Cauda equina syndrome:  No Compression fracture: No Abdominal aneurysm: No  COGNITION: Overall cognitive status: Within functional limits for tasks assessed  POSTURE: forward head, increased thoracic kyphosis, and weight shift left        GAIT ANALYSIS: Distance walked: 25 feet Assistive device utilized: None Level of assistance: Complete Independence Comments: Patient's gait grossly WFL; minimal antalgia  SENSATION: WFL   LUMBAR ROM:   AROM eval  Flexion Mid shin  Extension 0-5  Right lateral flexion Mid thigh  Left lateral flexion Mid thigh  Right rotation   Left rotation    (Blank rows = not tested; * = limited by pain)  LOWER EXTREMITY MMT:    Left ankle/hip/knee grossly 4-/5  Right ankle/hip/knee grossly 4-/5   LOWER EXTREMITY ROM:     Left ankle/hip/knee AROM grossly WFL  Right ankle/hip/knee AROM grossly WFL  PALPATION: Moderate tenderness to palpation/ muscle guarding lumbar paraspinals  --------------------------------------------------------------------------------------------- TODAY'S TREATMENT:                                                                                                                              DATE:   03/17/23 Right sidelying  Manual therapy including but not limited to: soft tissue mobilization to QL, parapsinals, strain/counter-strain QL stretch, passive stretch to left hip flexor, QL   Clamshells with G theraband, 2x10  Supine  Hip abduction with G theraband 2x10  Bridges + hip abduction with G theraband 2x10  03/10/23 PT Initial Eval   PATIENT EDUCATION:  Education details: activity modification, plan of care  Person educated: Patient Education method: Explanation Education comprehension: verbalized understanding  HOME EXERCISE PROGRAM: Access Code: [email or web address] URL: [email or web address] Date: 03/17/2023 Prepared by: Seymour Bars  Exercises - Clamshell with Resistance  - 2 x daily - 2 sets - 10 reps - Clamshell  with  Resistance (Mirrored)  - 2 x daily - 2 sets - 10 reps - Supine Bridge with Resistance Band  - 2 x daily - 2 sets - 10 reps --------------------------------------------------------------------------------------------- ASSESSMENT:  CLINICAL IMPRESSION: Today: Patient arrives to session with increased left QL, left hip pain. PT session began with manual therapy to address left QL, lumbar parapsinal muscle guarding; patient tolerated well. PT then initiated basic lumbar stability therapeutic exercise to address hip/ core stability. Patient able to tolerate HEP well. Patient will continue to benefit from PT to return to prior level of function     Eval: Patient is a 52 y.o. y.o. female who was seen today for physical therapy evaluation and treatment for low back pain and muscle weakness secondary to scoliosis. Patient presents to PT with the following objective impairments: decreased endurance, decreased ROM, decreased strength, impaired flexibility, and postural dysfunction. These impairments limit the patient in activities such as carrying, lifting, bending, standing, squatting, stairs, bed mobility, and caring for others. These impairments also limit the patient in participation such as meal prep, cleaning, laundry, medication management, driving, shopping, community activity, and yard work. The patient will benefit from PT to address the limitations/impairments listed below to return to their prior level of function in the domains of activity and participation.    PERSONAL FACTORS: Time since onset of injury/illness/exacerbation are also affecting patient's functional outcome.   REHAB POTENTIAL: Fair    CLINICAL DECISION MAKING: Stable/uncomplicated  EVALUATION COMPLEXITY: Low  --------------------------------------------------------------------------------------------- GOALS: Goals reviewed with patient? No  SHORT TERM GOALS: Target date: 03/31/2023    Patient will be able to score a  </= 40% on the  Oswestry  to demonstrate an improvement in overall housework, ADL completion, mobility, and self-care. Baseline: Goal status: INITIAL  2. Patient will be independent with a basic stretching/strengthening HEP  Baseline:  Goal status: INITIAL   LONG TERM GOALS: Target date: 04/21/2023    Patient will be able to score a </= 35 on the  Oswestry  to demonstrate an improvement in overall housework, ADL completion, mobility, and self-care.Baseline:  Goal status: INITIAL  2.   Patient will able to lift 10 pounds overhead with both arms 5 times with 1-2/10 pain to faciliate pain free participation at work. Baseline:  Goal status: INITIAL  3.  Patient will be independent with a comprehensive strengthening HEP  Baseline:  Goal status: INITIAL  --------------------------------------------------------------------------------------------- PLAN:  PT FREQUENCY: 1-2x/week  PT DURATION: 6 weeks  PLANNED INTERVENTIONS: 97110-Therapeutic exercises, 97530- Therapeutic activity, 97112- Neuromuscular re-education, 97535- Self Care, 10272- Manual therapy, [phone]- Gait training, Balance training, Stair training, Taping, Dry Needling, Joint mobilization, Joint manipulation, Spinal manipulation, Spinal mobilization, Cryotherapy, and Moist heat.  PLAN FOR NEXT SESSION: Progress lumbar stability   Seymour Bars, PT 03/17/2023, 3:26 PM

## 2023-03-20 ENCOUNTER — Ambulatory Visit (HOSPITAL_COMMUNITY): Payer: Medicaid Other

## 2023-03-20 ENCOUNTER — Ambulatory Visit: Payer: Medicaid Other | Admitting: Family Medicine

## 2023-03-20 ENCOUNTER — Encounter (HOSPITAL_COMMUNITY): Payer: Medicaid Other

## 2023-03-20 DIAGNOSIS — M6281 Muscle weakness (generalized): Secondary | ICD-10-CM | POA: Diagnosis not present

## 2023-03-20 DIAGNOSIS — M4125 Other idiopathic scoliosis, thoracolumbar region: Secondary | ICD-10-CM

## 2023-03-20 DIAGNOSIS — M5459 Other low back pain: Secondary | ICD-10-CM

## 2023-03-20 NOTE — Therapy (Signed)
OUTPATIENT PHYSICAL THERAPY EVALUATION (THORACOLUMBAR)   Patient Name: [name] [name] MRN: [id number] DOB:11/22/1970, 52 y.o., female Today's Date: 03/20/2023  END OF SESSION:   PT End of Session - 03/20/23 1436     Visit Number 3    Number of Visits 10    Authorization Type [employer]    Authorization Time Period 11/15-1/14    Authorization - Visit Number 3    Authorization - Number of Visits 10    Progress Note Due on Visit 10    PT Start Time 0230    PT Stop Time 0310    PT Time Calculation (min) 40 min    Activity Tolerance Patient tolerated treatment well    Behavior During Therapy WFL for tasks assessed/performed              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    Endometriosis    Fibroid uterus    Psoriasis    Scoliosis    Past Surgical History:  Procedure Laterality Date   ABDOMINAL HYSTERECTOMY N/A 01/21/2015   Procedure: HYSTERECTOMY ABDOMINAL WITH IVP;  Surgeon: Lazaro Arms, MD;  Location: [facility];  Service: Gynecology;  Laterality: N/A;   SALPINGOOPHORECTOMY Bilateral 01/21/2015   Procedure: BILATERAL SALPINGO OOPHORECTOMY;  Surgeon: Lazaro Arms, MD;  Location: [facility];  Service: Gynecology;  Laterality: Bilateral;   Patient Active Problem List   Diagnosis Date Noted   Scoliosis 02/09/2023   Physical exam, annual 02/09/2023   Obesity (BMI 30.0-34.9) 02/09/2023   Psoriatic arthritis (HCC) 02/09/2023   Depression, recurrent (HCC) 02/09/2023   Vitamin D deficiency 02/09/2023   S/P total hysterectomy and bilateral salpingo-oophorectomy 01/21/2015   DEPRESSION 12/15/2006    PCP: Park Meo, [facility] - General   REFERRING PROVIDER: Eldred Manges, MD   REFERRING DIAG:  Diagnosis  M41.25 (ICD-10-CM) - Other idiopathic scoliosis, thoracolumbar region    Rationale for Evaluation and Treatment: Rehabilitation  THERAPY DIAG:  Other idiopathic  scoliosis, thoracolumbar region  Muscle weakness (generalized)  Other low back pain  ONSET DATE: chronic scoliosis  --------------------------------------------------------------------------------------------- SUBJECTIVE:                                                                                                                                                                                           SUBJECTIVE STATEMENT:  Today: Patient with 7/10 back pain \\today ; states she had a lot of relief for the whole weekend after her last PT visit    Patient has had chronic scoliosis daily pain  for year. Patient was able to get insurance and saw MD last week. MD referred to OPPT   PERTINENT HISTORY:  DDD Psoriatic Arthritis   PAIN:  Are you having pain? Yes: NPRS scale: 4/10 Pain location: lumbar Pain description: sharp, constant Aggravating factors: standing Relieving factors: rest  PRECAUTIONS: None  RED FLAGS: None   WEIGHT BEARING RESTRICTIONS: No  FALLS:  Has patient fallen in last 6 months? Yes. Number of falls 3; patient was on Gabapentin Spring 2024  LIVING ENVIRONMENT: Lives with: lives with their family Lives in: House/apartment Stairs: Yes: Internal: 13 steps; can reach both Has following equipment at home: Grab bars How many hours do you sleep every night: 8 How many minutes of moderate exercise do you get weekly: 60 minutes  OCCUPATION: [employer]- on feet , bending, lifting   PLOF: Independent   PATIENT GOALS: to decrease pain   NEXT MD VISIT: 04/20/23 --------------------------------------------------------------------------------------------- OBJECTIVE:   DIAGNOSTIC FINDINGS:  IMPRESSION: Moderate S-shaped scoliotic curvature of the thoracolumbar spine, 40 degrees in the thoracic spine and 20 degrees in the lumbar spine. Lumbar scoliotic curvature has increased from 2017 exam  PATIENT SURVEYS:  Back index: Oswestry Score: 22 / 50 or 44  %  SCREENING FOR RED FLAGS: Bowel or bladder incontinence: No Spinal tumors: No Cauda equina syndrome: No Compression fracture: No Abdominal aneurysm: No  COGNITION: Overall cognitive status: Within functional limits for tasks assessed  POSTURE: forward head, increased thoracic kyphosis, and weight shift left Left lumbar curvature: elongated; thoracic shortened  Right lumbar curvatures: shortened          GAIT ANALYSIS: Distance walked: 25 feet Assistive device utilized: None Level of assistance: Complete Independence Comments: Patient's gait grossly WFL; minimal antalgia  SENSATION: WFL   LUMBAR ROM:   AROM eval  Flexion Mid shin  Extension 0-5  Right lateral flexion Mid thigh  Left lateral flexion Mid thigh  Right rotation   Left rotation    (Blank rows = not tested; * = limited by pain)  LOWER EXTREMITY MMT:    Left ankle/hip/knee grossly 4-/5  Right ankle/hip/knee grossly 4-/5   LOWER EXTREMITY ROM:     Left ankle/hip/knee AROM grossly WFL  Right ankle/hip/knee AROM grossly WFL  PALPATION: Moderate tenderness to palpation/ muscle guarding lumbar paraspinals  --------------------------------------------------------------------------------------------- TODAY'S TREATMENT:                                                                                                                              DATE:   03/20/23 Prone Press Up with PT overpressure x10 Child's Pose Stretch  1-64min hold Cat Cow 2x10 Side Plank on Knees  2x10 HEP modification   03/17/23 Right sidelying  Manual therapy including but not limited to: soft tissue mobilization to QL, parapsinals, strain/counter-strain QL stretch, passive stretch to left hip flexor, QL   Clamshells with G theraband, 2x10  Supine  Hip abduction with G theraband 2x10  Bridges + hip abduction with  G theraband 2x10  03/10/23 PT Initial Eval   PATIENT EDUCATION:  Education details: activity  modification, plan of care  Person educated: Patient Education method: Explanation Education comprehension: verbalized understanding  HOME EXERCISE PROGRAM: Access Code: [email or web address] URL: [email or web address] Date: 03/20/2023 Prepared by: Seymour Bars  Exercises - Prone Press Up  - 2 x daily - 10 reps - Child's Pose Stretch  - 2 x daily - 1-18minutes hold - Cat Cow  - 2 x daily - 10 reps - Side Plank on Knees  - 2 x daily - 10 reps - Side Plank on Knees (Mirrored)  - 2 x daily - 10 reps - Clamshell with Resistance  - 2 x daily - 2 sets - 10 reps - Supine Bridge with Resistance Band  - 2 x daily - 2 sets - 10 reps - Clamshell with Resistance (Mirrored)  - 2 x daily - 2 sets - 10 reps --------------------------------------------------------------------------------------------- ASSESSMENT:  CLINICAL IMPRESSION: Today: Patient arrives to session with increased left QL, left hip pain. PT intiated basic lumbar stability/ postural therapeutic exercise to address pain. Patient requires minimal verbal/tactile cues for proper progression. No increase in pain levels; tolerated well. Patient able to tolerate HEP well. Patient will continue to benefit from PT to return to prior level of function     Eval: Patient is a 52 y.o. y.o. female who was seen today for physical therapy evaluation and treatment for low back pain and muscle weakness secondary to scoliosis. Patient presents to PT with the following objective impairments: decreased endurance, decreased ROM, decreased strength, impaired flexibility, and postural dysfunction. These impairments limit the patient in activities such as carrying, lifting, bending, standing, squatting, stairs, bed mobility, and caring for others. These impairments also limit the patient in participation such as meal prep, cleaning, laundry, medication management, driving, shopping, community activity, and yard work. The patient will benefit from PT to address  the limitations/impairments listed below to return to their prior level of function in the domains of activity and participation.    PERSONAL FACTORS: Time since onset of injury/illness/exacerbation are also affecting patient's functional outcome.   REHAB POTENTIAL: Fair    CLINICAL DECISION MAKING: Stable/uncomplicated  EVALUATION COMPLEXITY: Low  --------------------------------------------------------------------------------------------- GOALS: Goals reviewed with patient? No  SHORT TERM GOALS: Target date: 03/31/2023    Patient will be able to score a </= 40% on the  Oswestry  to demonstrate an improvement in overall housework, ADL completion, mobility, and self-care. Baseline: Goal status: INITIAL  2. Patient will be independent with a basic stretching/strengthening HEP  Baseline:  Goal status: INITIAL   LONG TERM GOALS: Target date: 04/21/2023    Patient will be able to score a </= 35 on the  Oswestry  to demonstrate an improvement in overall housework, ADL completion, mobility, and self-care.Baseline:  Goal status: INITIAL  2.   Patient will able to lift 10 pounds overhead with both arms 5 times with 1-2/10 pain to faciliate pain free participation at work. Baseline:  Goal status: INITIAL  3.  Patient will be independent with a comprehensive strengthening HEP  Baseline:  Goal status: INITIAL  --------------------------------------------------------------------------------------------- PLAN:  PT FREQUENCY: 1-2x/week  PT DURATION: 6 weeks  PLANNED INTERVENTIONS: 97110-Therapeutic exercises, 97530- Therapeutic activity, 97112- Neuromuscular re-education, 97535- Self Care, 44034- Manual therapy, [phone]- Gait training, Balance training, Stair training, Taping, Dry Needling, Joint mobilization, Joint manipulation, Spinal manipulation, Spinal mobilization, Cryotherapy, and Moist heat.  PLAN FOR NEXT SESSION: Progress lumbar stability   Seymour Bars,  PT  03/20/2023, 3:27 PM

## 2023-03-22 ENCOUNTER — Encounter (HOSPITAL_COMMUNITY): Payer: Medicaid Other

## 2023-03-26 DIAGNOSIS — Z419 Encounter for procedure for purposes other than remedying health state, unspecified: Secondary | ICD-10-CM | POA: Diagnosis not present

## 2023-03-29 ENCOUNTER — Ambulatory Visit
Admission: RE | Admit: 2023-03-29 | Discharge: 2023-03-29 | Disposition: A | Discharge status: Home / Self Care | Payer: Medicaid Other | Source: Ambulatory Visit | Attending: Family Medicine

## 2023-03-29 DIAGNOSIS — Z1231 Encounter for screening mammogram for malignant neoplasm of breast: Secondary | ICD-10-CM

## 2023-03-31 ENCOUNTER — Other Ambulatory Visit: Payer: Self-pay | Admitting: Family Medicine

## 2023-03-31 ENCOUNTER — Ambulatory Visit (HOSPITAL_COMMUNITY): Payer: Medicaid Other | Attending: Orthopaedic Surgery

## 2023-03-31 DIAGNOSIS — M4125 Other idiopathic scoliosis, thoracolumbar region: Secondary | ICD-10-CM | POA: Insufficient documentation

## 2023-03-31 DIAGNOSIS — M5459 Other low back pain: Secondary | ICD-10-CM | POA: Insufficient documentation

## 2023-03-31 DIAGNOSIS — R928 Other abnormal and inconclusive findings on diagnostic imaging of breast: Secondary | ICD-10-CM

## 2023-03-31 DIAGNOSIS — M6281 Muscle weakness (generalized): Secondary | ICD-10-CM | POA: Diagnosis not present

## 2023-03-31 NOTE — Therapy (Signed)
OUTPATIENT PHYSICAL THERAPY TREATMENT   Patient Name: [name] [name] MRN: [id number] DOB:01-Feb-1971, 52 y.o., female Today's Date: 03/31/2023  END OF SESSION:   PT End of Session - 03/31/23 0804     Visit Number 4    Number of Visits 10    Authorization Type [employer]    Authorization Time Period 11/15-1/14    Authorization - Number of Visits 10    Progress Note Due on Visit 10    PT Start Time 0803    PT Stop Time 0843    PT Time Calculation (min) 40 min    Activity Tolerance Patient tolerated treatment well    Behavior During Therapy [facility] for tasks assessed/performed              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    Endometriosis    Fibroid uterus    Psoriasis    Scoliosis    Past Surgical History:  Procedure Laterality Date   ABDOMINAL HYSTERECTOMY N/A 01/21/2015   Procedure: HYSTERECTOMY ABDOMINAL WITH IVP;  Surgeon: Lazaro Arms, MD;  Location: [facility];  Service: Gynecology;  Laterality: N/A;   SALPINGOOPHORECTOMY Bilateral 01/21/2015   Procedure: BILATERAL SALPINGO OOPHORECTOMY;  Surgeon: Lazaro Arms, MD;  Location: [facility];  Service: Gynecology;  Laterality: Bilateral;   Patient Active Problem List   Diagnosis Date Noted   Scoliosis 02/09/2023   Physical exam, annual 02/09/2023   Obesity (BMI 30.0-34.9) 02/09/2023   Psoriatic arthritis (HCC) 02/09/2023   Depression, recurrent (HCC) 02/09/2023   Vitamin D deficiency 02/09/2023   S/P total hysterectomy and bilateral salpingo-oophorectomy 01/21/2015   DEPRESSION 12/15/2006    PCP: Park Meo, [facility]PP[facility] - General   REFERRING PROVIDER: Eldred Manges, MD   REFERRING DIAG:  Diagnosis  M41.25 (ICD-10-CM) - Other idiopathic scoliosis, thoracolumbar region    Rationale for Evaluation and Treatment: Rehabilitation  THERAPY DIAG:  Other idiopathic scoliosis, thoracolumbar region  Muscle weakness  (generalized)  Other low back pain  ONSET DATE: chronic scoliosis  --------------------------------------------------------------------------------------------- SUBJECTIVE:                                                                                                                                                                                           SUBJECTIVE STATEMENT:  Today: Patient reports some relief with her HEP; still reports sciatica down left lower extremity    Patient has had chronic scoliosis daily pain for year. Patient was able to get insurance and saw MD last week. MD referred to [facility]  PERTINENT HISTORY:  DDD Psoriatic Arthritis   PAIN:  Are you having pain? Yes: NPRS scale: 4/10 Pain location: lumbar Pain description: sharp, constant Aggravating factors: standing Relieving factors: rest  PRECAUTIONS: None  RED FLAGS: None   WEIGHT BEARING RESTRICTIONS: No  FALLS:  Has patient fallen in last 6 months? Yes. Number of falls 3; patient was on Gabapentin Spring 2024  LIVING ENVIRONMENT: Lives with: lives with their family Lives in: House/apartment Stairs: Yes: Internal: 13 steps; can reach both Has following equipment at home: Grab bars How many hours do you sleep every night: 8 How many minutes of moderate exercise do you get weekly: 60 minutes  OCCUPATION: Pantry- on feet , bending, lifting   PLOF: Independent   PATIENT GOALS: to decrease pain   NEXT MD VISIT: 04/20/23 --------------------------------------------------------------------------------------------- OBJECTIVE:   DIAGNOSTIC FINDINGS:  IMPRESSION: Moderate S-shaped scoliotic curvature of the thoracolumbar spine, 40 degrees in the thoracic spine and 20 degrees in the lumbar spine. Lumbar scoliotic curvature has increased from 2017 exam  PATIENT SURVEYS:  Back index: Oswestry Score: 22 / 50 or 44 %  SCREENING FOR RED FLAGS: Bowel or bladder incontinence: No Spinal tumors:  No Cauda equina syndrome: No Compression fracture: No Abdominal aneurysm: No  COGNITION: Overall cognitive status: Within functional limits for tasks assessed  POSTURE: forward head, increased thoracic kyphosis, and weight shift left Left lumbar curvature: elongated; thoracic shortened  Right lumbar curvatures: shortened          GAIT ANALYSIS: Distance walked: 25 feet Assistive device utilized: None Level of assistance: Complete Independence Comments: Patient's gait grossly WFL; minimal antalgia  SENSATION: WFL   LUMBAR ROM:   AROM eval  Flexion Mid shin  Extension 0-5  Right lateral flexion Mid thigh  Left lateral flexion Mid thigh  Right rotation   Left rotation    (Blank rows = not tested; * = limited by pain)  LOWER EXTREMITY MMT:    Left ankle/hip/knee grossly 4-/5  Right ankle/hip/knee grossly 4-/5   LOWER EXTREMITY ROM:     Left ankle/hip/knee AROM grossly WFL  Right ankle/hip/knee AROM grossly WFL  PALPATION: Moderate tenderness to palpation/ muscle guarding lumbar paraspinals  --------------------------------------------------------------------------------------------- TODAY'S TREATMENT:                                                                                                                              DATE:   03/31/23 Supine  HS stretch with strap   Assisted SLR's, bilateral   Manual therapy: short arc distraction to left hip for flexion ROM   SKTC   SLRs- NT well   Hip flexion with Y theraband    03/20/23 Prone Press Up with PT overpressure x10 Child's Pose Stretch  1-61min hold Cat Cow 2x10 Side Plank on Knees  2x10 HEP modification   03/17/23 Right sidelying  Manual therapy including but not limited to: soft tissue mobilization to QL, parapsinals, strain/counter-strain QL stretch, passive stretch to left hip  flexor, QL   Clamshells with G theraband, 2x10  Supine  Hip abduction with G theraband 2x10  Bridges + hip  abduction with G theraband 2x10  03/10/23 PT Initial Eval   PATIENT EDUCATION:  Education details: activity modification, plan of care  Person educated: Patient Education method: Explanation Education comprehension: verbalized understanding  HOME EXERCISE PROGRAM: Access Code: [email or web address] URL: [email or web address] Date: 03/31/2023 Prepared by: Seymour Bars  Exercises - Prone Press Up  - 2 x daily - 10 reps - Child's Pose Stretch  - 2 x daily - 1-45minutes hold - Cat Cow  - 2 x daily - 10 reps - Side Plank on Knees  - 2 x daily - 10 reps - Side Plank on Knees (Mirrored)  - 2 x daily - 10 reps - Clamshell with Resistance  - 2 x daily - 2 sets - 10 reps - Supine Bridge with Resistance Band  - 2 x daily - 2 sets - 10 reps - Clamshell with Resistance (Mirrored)  - 2 x daily - 2 sets - 10 reps - Supine Single Knee to Chest Stretch  - 2 x daily - 2 sets - 10 reps - Supine Hip Flexion with Resistance Loop  - 2 x daily - 2 sets - 10 reps  ------------------------------------------------------------- ASSESSMENT:  CLINICAL IMPRESSION: Today: Patient arrives to session with minimal sciatica on left lower extremity. PT to find decreased left hip mobility into flexion. Session initially focused on hip ROM followed by hip flexor strengthening. HEP adjusted to include hip flexor therapeutic exercise. Patient tolerated well with no increase in pain levels.  Patient will continue to benefit from PT to return to prior level of function     Eval: Patient is a 52 y.o. y.o. female who was seen today for physical therapy evaluation and treatment for low back pain and muscle weakness secondary to scoliosis. Patient presents to PT with the following objective impairments: decreased endurance, decreased ROM, decreased strength, impaired flexibility, and postural dysfunction. These impairments limit the patient in activities such as carrying, lifting, bending, standing, squatting, stairs, bed  mobility, and caring for others. These impairments also limit the patient in participation such as meal prep, cleaning, laundry, medication management, driving, shopping, community activity, and yard work. The patient will benefit from PT to address the limitations/impairments listed below to return to their prior level of function in the domains of activity and participation.    PERSONAL FACTORS: Time since onset of injury/illness/exacerbation are also affecting patient's functional outcome.   REHAB POTENTIAL: Fair    CLINICAL DECISION MAKING: Stable/uncomplicated  EVALUATION COMPLEXITY: Low  --------------------------------------------------------------------------------------------- GOALS: Goals reviewed with patient? No  SHORT TERM GOALS: Target date: 03/31/2023    Patient will be able to score a </= 40% on the  Oswestry  to demonstrate an improvement in overall housework, ADL completion, mobility, and self-care. Baseline: Goal status: INITIAL  2. Patient will be independent with a basic stretching/strengthening HEP  Baseline:  Goal status: INITIAL   LONG TERM GOALS: Target date: 04/21/2023    Patient will be able to score a </= 35 on the  Oswestry  to demonstrate an improvement in overall housework, ADL completion, mobility, and self-care.Baseline:  Goal status: INITIAL  2.   Patient will able to lift 10 pounds overhead with both arms 5 times with 1-2/10 pain to faciliate pain free participation at work. Baseline:  Goal status: INITIAL  3.  Patient will be independent with a comprehensive strengthening HEP  Baseline:  Goal status:  INITIAL  --------------------------------------------------------------------------------------------- PLAN:  PT FREQUENCY: 1-2x/week  PT DURATION: 6 weeks  PLANNED INTERVENTIONS: 97110-Therapeutic exercises, 97530- Therapeutic activity, 97112- Neuromuscular re-education, 97535- Self Care, 16109- Manual therapy, [phone]- Gait training,  Balance training, Stair training, Taping, Dry Needling, Joint mobilization, Joint manipulation, Spinal manipulation, Spinal mobilization, Cryotherapy, and Moist heat.  PLAN FOR NEXT SESSION: Progress lumbar stability   Seymour Bars, PT 03/31/2023, 8:05 AM

## 2023-04-02 ENCOUNTER — Other Ambulatory Visit: Payer: Self-pay | Admitting: Family Medicine

## 2023-04-07 ENCOUNTER — Ambulatory Visit (HOSPITAL_COMMUNITY): Payer: Medicaid Other

## 2023-04-07 DIAGNOSIS — M6281 Muscle weakness (generalized): Secondary | ICD-10-CM | POA: Diagnosis not present

## 2023-04-07 DIAGNOSIS — M5459 Other low back pain: Secondary | ICD-10-CM

## 2023-04-07 DIAGNOSIS — M4125 Other idiopathic scoliosis, thoracolumbar region: Secondary | ICD-10-CM

## 2023-04-07 NOTE — Therapy (Signed)
OUTPATIENT PHYSICAL THERAPY TREATMENT   Patient Name: [name] [name] MRN: [id number] DOB:October 10, 1970, 52 y.o., female Today's Date: 04/07/2023  END OF SESSION:   PT End of Session - 04/07/23 1521     Visit Number 5    Number of Visits 10    Authorization Type [employer]    Authorization Time Period 11/15-1/14    Authorization - Number of Visits 10    Progress Note Due on Visit 10    PT Start Time 0315    PT Stop Time 0355    PT Time Calculation (min) 40 min    Activity Tolerance Patient tolerated treatment well    Behavior During Therapy WFL for tasks assessed/performed              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    Endometriosis    Fibroid uterus    Psoriasis    Scoliosis    Past Surgical History:  Procedure Laterality Date   ABDOMINAL HYSTERECTOMY N/A 01/21/2015   Procedure: HYSTERECTOMY ABDOMINAL WITH IVP;  Surgeon: Lazaro Arms, MD;  Location: [facility];  Service: Gynecology;  Laterality: N/A;   SALPINGOOPHORECTOMY Bilateral 01/21/2015   Procedure: BILATERAL SALPINGO OOPHORECTOMY;  Surgeon: Lazaro Arms, MD;  Location: [facility];  Service: Gynecology;  Laterality: Bilateral;   Patient Active Problem List   Diagnosis Date Noted   Scoliosis 02/09/2023   Physical exam, annual 02/09/2023   Obesity (BMI 30.0-34.9) 02/09/2023   Psoriatic arthritis (HCC) 02/09/2023   Depression, recurrent (HCC) 02/09/2023   Vitamin D deficiency 02/09/2023   S/P total hysterectomy and bilateral salpingo-oophorectomy 01/21/2015   DEPRESSION 12/15/2006    PCP: Park Meo, [facility] - General   REFERRING PROVIDER: Eldred Manges, MD   REFERRING DIAG:  Diagnosis  M41.25 (ICD-10-CM) - Other idiopathic scoliosis, thoracolumbar region    Rationale for Evaluation and Treatment: Rehabilitation  THERAPY DIAG:  No diagnosis found.  ONSET DATE: chronic scoliosis   --------------------------------------------------------------------------------------------- SUBJECTIVE:                                                                                                                                                                                           SUBJECTIVE STATEMENT:  Today: Patient reports some relief with her HEP; 4/10 low back pain    Patient has had chronic scoliosis daily pain for year. Patient was able to get insurance and saw MD last week. MD referred to [facility]   PERTINENT HISTORY:  DDD Psoriatic Arthritis   PAIN:  Are you  having pain? Yes: NPRS scale: 4/10 Pain location: lumbar Pain description: sharp, constant Aggravating factors: standing Relieving factors: rest  PRECAUTIONS: None  RED FLAGS: None   WEIGHT BEARING RESTRICTIONS: No  FALLS:  Has patient fallen in last 6 months? Yes. Number of falls 3; patient was on Gabapentin Spring 2024  LIVING ENVIRONMENT: Lives with: lives with their family Lives in: House/apartment Stairs: Yes: Internal: 13 steps; can reach both Has following equipment at home: Grab bars How many hours do you sleep every night: 8 How many minutes of moderate exercise do you get weekly: 60 minutes  OCCUPATION: Pantry- on feet , bending, lifting   PLOF: Independent   PATIENT GOALS: to decrease pain   NEXT MD VISIT: 04/20/23 --------------------------------------------------------------------------------------------- OBJECTIVE:   DIAGNOSTIC FINDINGS:  IMPRESSION: Moderate S-shaped scoliotic curvature of the thoracolumbar spine, 40 degrees in the thoracic spine and 20 degrees in the lumbar spine. Lumbar scoliotic curvature has increased from 2017 exam  PATIENT SURVEYS:  Back index: Oswestry Score: 22 / 50 or 44 %  SCREENING FOR RED FLAGS: Bowel or bladder incontinence: No Spinal tumors: No Cauda equina syndrome: No Compression fracture: No Abdominal aneurysm: No  COGNITION: Overall  cognitive status: Within functional limits for tasks assessed  POSTURE: forward head, increased thoracic kyphosis, and weight shift left Left lumbar curvature: elongated; thoracic shortened  Right lumbar curvatures: shortened          GAIT ANALYSIS: Distance walked: 25 feet Assistive device utilized: None Level of assistance: Complete Independence Comments: Patient's gait grossly WFL; minimal antalgia  SENSATION: WFL   LUMBAR ROM:   AROM eval  Flexion Mid shin  Extension 0-5  Right lateral flexion Mid thigh  Left lateral flexion Mid thigh  Right rotation   Left rotation    (Blank rows = not tested; * = limited by pain)  LOWER EXTREMITY MMT:    Left ankle/hip/knee grossly 4-/5  Right ankle/hip/knee grossly 4-/5   LOWER EXTREMITY ROM:     Left ankle/hip/knee AROM grossly WFL  Right ankle/hip/knee AROM grossly WFL  PALPATION: Moderate tenderness to palpation/ muscle guarding lumbar paraspinals  --------------------------------------------------------------------------------------------- TODAY'S TREATMENT:                                                                                                                              DATE:   04/07/23 Right sidelying  Manual therapy including but not limited to: soft tissue mobilization to QL, parapsinals, strain/counter-strain QL stretch, passive stretch to left hip flexor, QL first to left side then right side  Modified bridge pose   Modified hip bridges   03/31/23 Supine  HS stretch with strap   Assisted SLR's, bilateral   Manual therapy: short arc distraction to left hip for flexion ROM   SKTC   SLRs- NT well   Hip flexion with Y theraband    03/20/23 Prone Press Up with PT overpressure x10 Child's Pose Stretch  1-1min hold Cat Cow 2x10 Side Plank  on Knees  2x10 HEP modification   03/17/23 Right sidelying  Manual therapy including but not limited to: soft tissue mobilization to QL, parapsinals,  strain/counter-strain QL stretch, passive stretch to left hip flexor, QL   Clamshells with G theraband, 2x10  Supine  Hip abduction with G theraband 2x10  Bridges + hip abduction with G theraband 2x10  03/10/23 PT Initial Eval   PATIENT EDUCATION:  Education details: activity modification, plan of care  Person educated: Patient Education method: Explanation Education comprehension: verbalized understanding  HOME EXERCISE PROGRAM: Access Code: [email or web address] URL: [email or web address] Date: 03/31/2023 Prepared by: Seymour Bars  Exercises - Prone Press Up  - 2 x daily - 10 reps - Child's Pose Stretch  - 2 x daily - 1-86minutes hold - Cat Cow  - 2 x daily - 10 reps - Side Plank on Knees  - 2 x daily - 10 reps - Side Plank on Knees (Mirrored)  - 2 x daily - 10 reps - Clamshell with Resistance  - 2 x daily - 2 sets - 10 reps - Supine Bridge with Resistance Band  - 2 x daily - 2 sets - 10 reps - Clamshell with Resistance (Mirrored)  - 2 x daily - 2 sets - 10 reps - Supine Single Knee to Chest Stretch  - 2 x daily - 2 sets - 10 reps - Supine Hip Flexion with Resistance Loop  - 2 x daily - 2 sets - 10 reps  ------------------------------------------------------------- ASSESSMENT:  CLINICAL IMPRESSION: Today: Patient arrives to session with increased left QL. PT session began with manual therapy to address left QL, lumbar parapsinal muscle guarding; patient tolerated well. PT then performed bridges/modified bridge pose to alleviate pain. Patient able to tolerate session  well with no increase in pain  Patient will continue to benefit from PT to return to prior level of function     Eval: Patient is a 52 y.o. y.o. female who was seen today for physical therapy evaluation and treatment for low back pain and muscle weakness secondary to scoliosis. Patient presents to PT with the following objective impairments: decreased endurance, decreased ROM, decreased strength, impaired  flexibility, and postural dysfunction. These impairments limit the patient in activities such as carrying, lifting, bending, standing, squatting, stairs, bed mobility, and caring for others. These impairments also limit the patient in participation such as meal prep, cleaning, laundry, medication management, driving, shopping, community activity, and yard work. The patient will benefit from PT to address the limitations/impairments listed below to return to their prior level of function in the domains of activity and participation.    PERSONAL FACTORS: Time since onset of injury/illness/exacerbation are also affecting patient's functional outcome.   REHAB POTENTIAL: Fair    CLINICAL DECISION MAKING: Stable/uncomplicated  EVALUATION COMPLEXITY: Low  --------------------------------------------------------------------------------------------- GOALS: Goals reviewed with patient? No  SHORT TERM GOALS: Target date: 03/31/2023    Patient will be able to score a </= 40% on the  Oswestry  to demonstrate an improvement in overall housework, ADL completion, mobility, and self-care. Baseline: Goal status: INITIAL  2. Patient will be independent with a basic stretching/strengthening HEP  Baseline:  Goal status: INITIAL   LONG TERM GOALS: Target date: 04/21/2023    Patient will be able to score a </= 35 on the  Oswestry  to demonstrate an improvement in overall housework, ADL completion, mobility, and self-care.Baseline:  Goal status: INITIAL  2.   Patient will able to lift 10 pounds overhead with both arms 5 times with  1-2/10 pain to faciliate pain free participation at work. Baseline:  Goal status: INITIAL  3.  Patient will be independent with a comprehensive strengthening HEP  Baseline:  Goal status: INITIAL  --------------------------------------------------------------------------------------------- PLAN:  PT FREQUENCY: 1-2x/week  PT DURATION: 6 weeks  PLANNED INTERVENTIONS:  97110-Therapeutic exercises, 97530- Therapeutic activity, 97112- Neuromuscular re-education, 97535- Self Care, 16109- Manual therapy, [phone]- Gait training, Balance training, Stair training, Taping, Dry Needling, Joint mobilization, Joint manipulation, Spinal manipulation, Spinal mobilization, Cryotherapy, and Moist heat.  PLAN FOR NEXT SESSION: Progress lumbar stability   Seymour Bars, PT 04/07/2023, 3:21 PM

## 2023-04-10 ENCOUNTER — Encounter: Payer: Self-pay | Admitting: Family Medicine

## 2023-04-10 ENCOUNTER — Ambulatory Visit (INDEPENDENT_AMBULATORY_CARE_PROVIDER_SITE_OTHER): Payer: Medicaid Other | Admitting: Family Medicine

## 2023-04-10 VITALS — BP 128/92 | HR 99 | Temp 98.8°F | Ht 61.0 in | Wt 169.0 lb

## 2023-04-10 DIAGNOSIS — F339 Major depressive disorder, recurrent, unspecified: Secondary | ICD-10-CM | POA: Diagnosis not present

## 2023-04-10 NOTE — Assessment & Plan Note (Signed)
Improved on Cymbalta 60mg  daily and Buspar 7.5mg  BID. She is also working on pain management. Denies SI/HI. Continue current regimen and will consider increasing Buspar to 15mg  BID if needed. Follow up in 6 months or sooner if needed.      04/10/2023    8:29 AM 02/09/2023   12:01 PM  Depression screen PHQ 2/9  Decreased Interest 0 1  Down, Depressed, Hopeless 1 2  PHQ - 2 Score 1 3  Altered sleeping 1 3  Tired, decreased energy 1 3  Change in appetite 1 0  Feeling bad or failure about yourself  1 0  Trouble concentrating 0 2  Moving slowly or fidgety/restless 0 3  Suicidal thoughts 0 0  PHQ-9 Score 5 14  Difficult doing work/chores Somewhat difficult Very difficult

## 2023-04-10 NOTE — Progress Notes (Signed)
Subjective:  HPI: [name] [name] is a 52 y.o. female presenting on 04/10/2023 for Follow-up (6 wks f/u )   HPI Patient is in today for depression follow up. She reports her depression is improved on Cymbalta 60mg  daily and the addition of Buspar 7.5mg  BID. Her depression is laregly due to her physical limitations due to her pain resulting from her scoliosis and DJD. She has started physical therapy and also seen some improvement with this. Orthopedics is following, her next appointment is next week.      04/10/2023    8:29 AM 02/09/2023   12:01 PM  Depression screen PHQ 2/9  Decreased Interest 0 1  Down, Depressed, Hopeless 1 2  PHQ - 2 Score 1 3  Altered sleeping 1 3  Tired, decreased energy 1 3  Change in appetite 1 0  Feeling bad or failure about yourself  1 0  Trouble concentrating 0 2  Moving slowly or fidgety/restless 0 3  Suicidal thoughts 0 0  PHQ-9 Score 5 14  Difficult doing work/chores Somewhat difficult Very difficult     Review of Systems  All other systems reviewed and are negative.   Relevant past medical history reviewed and updated as indicated.   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   Current Outpatient Medications:    busPIRone (BUSPAR) 7.5 MG tablet, TAKE 1 TABLET(7.5 MG) BY MOUTH TWICE DAILY, Disp: 60 tablet, Rfl: 1    Cholecalciferol (VITAMIN D-3) 125 MCG (5000 UT) TABS, Take by mouth., Disp: , Rfl:    DULoxetine (CYMBALTA) 60 MG capsule, Take 60 mg by mouth daily. Per pt only take 1 before bedtime, Disp: , Rfl:    mometasone (ELOCON) 0.1 % cream, Apply to skin lesions twice daily, Disp: 45 g, Rfl: 2  Allergies  Allergen Reactions   Ibuprofen Diarrhea and Other (See Comments)    reflux   Meperidine Hcl Nausea And Vomiting    Objective:   BP (!) 128/92   Pulse 99   Temp 98.8 F (37.1 C) (Oral)   Ht 5\' 1"  (1.549 m)   Wt 169 lb (76.7 kg)   LMP 09/22/2014   SpO2 97%   BMI 31.93 kg/m      04/10/2023    8:11 AM 03/01/2023   10:09 AM 02/09/2023   11:06 AM  Vitals with BMI  Height 5\' 1"  5\' 1"  5\' 1"   Weight 169 lbs 173 lbs 173 lbs  BMI 31.95 32.7 32.7  Systolic 128  120  Diastolic 92  88  Pulse 99  107     Physical Exam Vitals and nursing note reviewed.  Constitutional:      Appearance: Normal appearance. She is normal weight.  HENT:     Head: Normocephalic and atraumatic.  Skin:    General: Skin is warm and dry.  Neurological:  General: No focal deficit present.     Mental Status: She is alert and oriented to person, place, and time. Mental status is at baseline.  Psychiatric:        Mood and Affect: Mood normal.        Behavior: Behavior normal.        Thought Content: Thought content normal.        Judgment: Judgment normal.     Assessment & Plan:  Depression, recurrent (HCC) Assessment & Plan: Improved on Cymbalta 60mg  daily and Buspar 7.5mg  BID. She is also working on pain management. Denies SI/HI. Continue current regimen and will consider increasing Buspar to 15mg  BID if needed. Follow up in 6 months or sooner if needed.      04/10/2023    8:29 AM 02/09/2023   12:01 PM  Depression screen PHQ 2/9  Decreased Interest 0 1  Down, Depressed, Hopeless 1 2  PHQ - 2 Score 1 3  Altered sleeping 1 3  Tired, decreased energy 1 3  Change in appetite 1 0  Feeling bad or  failure about yourself  1 0  Trouble concentrating 0 2  Moving slowly or fidgety/restless 0 3  Suicidal thoughts 0 0  PHQ-9 Score 5 14  Difficult doing work/chores Somewhat difficult Very difficult         Follow up plan: Return in about 6 months (around 10/09/2023) for anxiety/depression.  Park Meo, FNP

## 2023-04-13 DIAGNOSIS — L409 Psoriasis, unspecified: Secondary | ICD-10-CM | POA: Diagnosis not present

## 2023-04-13 DIAGNOSIS — S46919A Strain of unspecified muscle, fascia and tendon at shoulder and upper arm level, unspecified arm, initial encounter: Secondary | ICD-10-CM | POA: Diagnosis not present

## 2023-04-13 DIAGNOSIS — M2550 Pain in unspecified joint: Secondary | ICD-10-CM | POA: Diagnosis not present

## 2023-04-14 ENCOUNTER — Ambulatory Visit (HOSPITAL_COMMUNITY): Payer: Medicaid Other

## 2023-04-14 DIAGNOSIS — M6281 Muscle weakness (generalized): Secondary | ICD-10-CM

## 2023-04-14 DIAGNOSIS — M5459 Other low back pain: Secondary | ICD-10-CM | POA: Diagnosis not present

## 2023-04-14 DIAGNOSIS — M4125 Other idiopathic scoliosis, thoracolumbar region: Secondary | ICD-10-CM
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
## 2023-04-17 ENCOUNTER — Other Ambulatory Visit: Payer: Medicaid Other

## 2023-04-17 ENCOUNTER — Other Ambulatory Visit: Payer: Self-pay | Admitting: Family Medicine

## 2023-04-17 ENCOUNTER — Telehealth: Payer: Self-pay

## 2023-04-17 MED ORDER — BUSPIRONE HCL 10 MG PO TABS: 10.0000 mg | ORAL_TABLET | Freq: Two times a day (BID) | ORAL | 1 refills | Status: DC

## 2023-04-17 NOTE — Telephone Encounter (Signed)
Copied from CRM [id number]. Topic: Clinical - Medication Question >> Apr 17, 2023  3:14 PM Fonda Kinder J wrote: Reason for CRM: Pt wants to know if she can be written a prescription for a higher dosage of busPIRone (BUSPAR) 7.5 MG Pt callback: [phone].  Please advise?

## 2023-04-20 ENCOUNTER — Other Ambulatory Visit (INDEPENDENT_AMBULATORY_CARE_PROVIDER_SITE_OTHER): Payer: Self-pay

## 2023-04-20 ENCOUNTER — Ambulatory Visit: Payer: Medicaid Other | Admitting: Orthopaedic Surgery

## 2023-04-20 VITALS — Ht 61.0 in | Wt 169.0 lb

## 2023-04-20 DIAGNOSIS — M542 Cervicalgia: Secondary | ICD-10-CM

## 2023-04-20 DIAGNOSIS — M25511 Pain in right shoulder: Secondary | ICD-10-CM
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
## 2023-04-24 ENCOUNTER — Other Ambulatory Visit: Payer: Self-pay | Admitting: Family Medicine

## 2023-04-24 ENCOUNTER — Ambulatory Visit
Admission: RE | Admit: 2023-04-24 | Discharge: 2023-04-24 | Disposition: A | Discharge status: Home / Self Care | Payer: Medicaid Other | Source: Ambulatory Visit | Attending: Family Medicine | Admitting: Family Medicine

## 2023-04-24 ENCOUNTER — Ambulatory Visit
Admission: RE | Admit: 2023-04-24 | Discharge: 2023-04-24 | Disposition: A | Discharge status: Home / Self Care | Payer: Medicaid Other | Source: Ambulatory Visit | Attending: Family Medicine

## 2023-04-24 DIAGNOSIS — R928 Other abnormal and inconclusive findings on diagnostic imaging of breast: Secondary | ICD-10-CM

## 2023-04-24 DIAGNOSIS — N6322 Unspecified lump in the left breast, upper inner quadrant: Secondary | ICD-10-CM | POA: Diagnosis not present

## 2023-04-24 DIAGNOSIS — R590 Localized enlarged lymph nodes: Secondary | ICD-10-CM | POA: Diagnosis not present

## 2023-04-24 DIAGNOSIS — N6320 Unspecified lump in the left breast, unspecified quadrant: Secondary | ICD-10-CM

## 2023-04-26 DIAGNOSIS — Z419 Encounter for procedure for purposes other than remedying health state, unspecified: Secondary | ICD-10-CM | POA: Diagnosis not present

## 2023-04-28 ENCOUNTER — Ambulatory Visit
Admission: RE | Admit: 2023-04-28 | Discharge: 2023-04-28 | Disposition: A | Discharge status: Home / Self Care | Payer: Medicaid Other | Source: Ambulatory Visit | Attending: Family Medicine | Admitting: Family Medicine

## 2023-04-28 ENCOUNTER — Ambulatory Visit
Admission: RE | Admit: 2023-04-28 | Discharge: 2023-04-28 | Disposition: A | Discharge status: Home / Self Care | Payer: Medicaid Other | Source: Ambulatory Visit | Attending: Family Medicine

## 2023-04-28 DIAGNOSIS — N6322 Unspecified lump in the left breast, upper inner quadrant: Secondary | ICD-10-CM | POA: Diagnosis not present

## 2023-04-28 DIAGNOSIS — N6320 Unspecified lump in the left breast, unspecified quadrant: Secondary | ICD-10-CM

## 2023-04-28 DIAGNOSIS — D242 Benign neoplasm of left breast: Secondary | ICD-10-CM | POA: Diagnosis not present

## 2023-04-28 HISTORY — PX: BREAST BIOPSY: SHX20

## 2023-05-01 LAB — SURGICAL PATHOLOGY

## 2023-05-27 DIAGNOSIS — Z419 Encounter for procedure for purposes other than remedying health state, unspecified: Secondary | ICD-10-CM | POA: Diagnosis not present

## 2023-05-27 IMAGING — DX DG CHEST 1V PORT
1 series · 1 of 1 positions shown · non-contrast
Comparison: None.

CLINICAL DATA: Dyspnea

EXAM:
PORTABLE CHEST 1 VIEW

[chest ap]
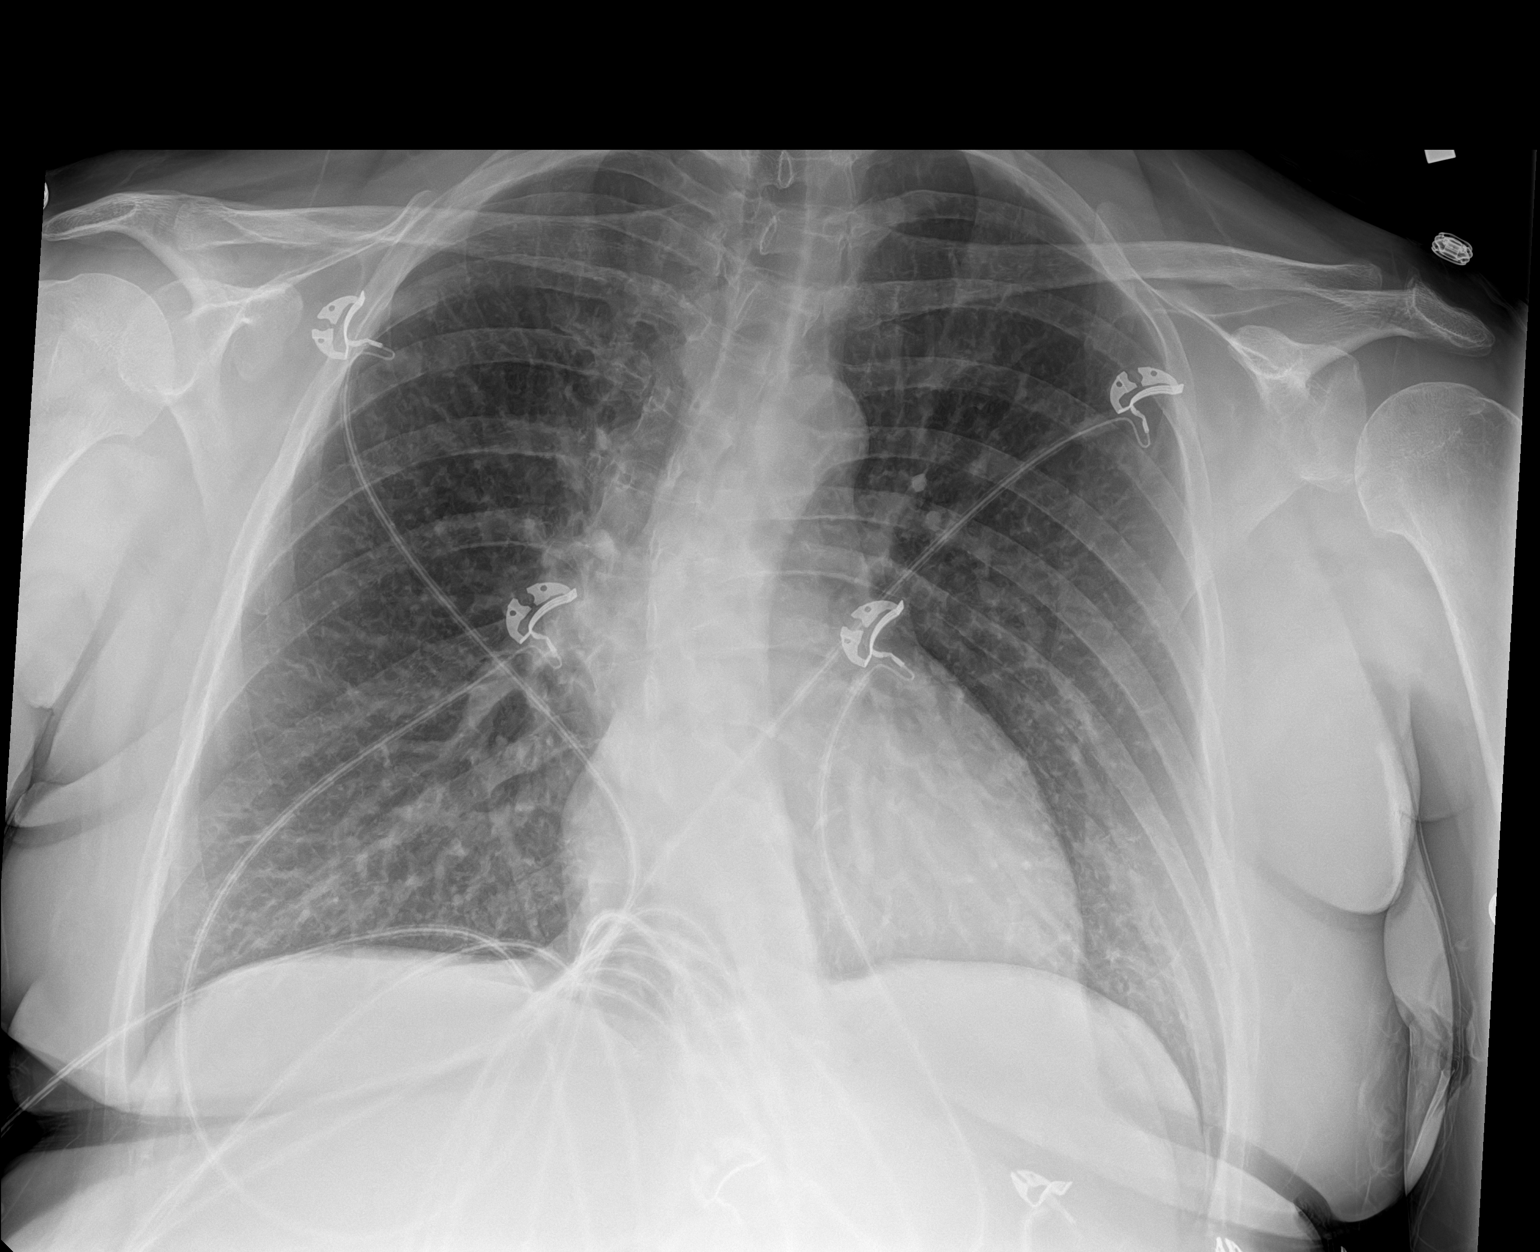

[1 of 1 positions shown; findings below may reference images not displayed]

FINDINGS: Lungs are clear. No pneumothorax or pleural effusion. Cardiac size
within normal limits. Pulmonary vascularity is normal. Moderate
thoracic dextroscoliosis. No acute bone abnormality.
IMPRESSION: No active disease.

## 2023-05-31 ENCOUNTER — Ambulatory Visit: Payer: Medicaid Other | Admitting: Orthopaedic Surgery

## 2023-05-31 ENCOUNTER — Encounter: Payer: Self-pay | Admitting: Orthopaedic Surgery

## 2023-05-31 VITALS — Ht 61.0 in | Wt 165.0 lb

## 2023-05-31 DIAGNOSIS — M542 Cervicalgia: Secondary | ICD-10-CM

## 2023-05-31 DIAGNOSIS — M4712 Other spondylosis with myelopathy, cervical region: Secondary | ICD-10-CM

## 2023-05-31 DIAGNOSIS — M47812 Spondylosis without myelopathy or radiculopathy, cervical region: Secondary | ICD-10-CM | POA: Diagnosis not present
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
## 2023-06-01 DIAGNOSIS — M4712 Other spondylosis with myelopathy, cervical region: Secondary | ICD-10-CM | POA: Insufficient documentation

## 2023-06-02 ENCOUNTER — Ambulatory Visit: Payer: Medicaid Other

## 2023-06-04 ENCOUNTER — Ambulatory Visit (HOSPITAL_COMMUNITY)
Admission: RE | Admit: 2023-06-04 | Discharge: 2023-06-04 | Disposition: A | Discharge status: Home / Self Care | Payer: Medicaid Other | Source: Ambulatory Visit | Attending: Orthopaedic Surgery | Admitting: Orthopaedic Surgery

## 2023-06-04 DIAGNOSIS — M4802 Spinal stenosis, cervical region: Secondary | ICD-10-CM | POA: Diagnosis not present

## 2023-06-04 DIAGNOSIS — M50221 Other cervical disc displacement at C4-C5 level: Secondary | ICD-10-CM | POA: Diagnosis not present

## 2023-06-04 DIAGNOSIS — M50222 Other cervical disc displacement at C5-C6 level: Secondary | ICD-10-CM | POA: Diagnosis not present

## 2023-06-04 DIAGNOSIS — M47812 Spondylosis without myelopathy or radiculopathy, cervical region: Secondary | ICD-10-CM | POA: Diagnosis not present

## 2023-06-04 DIAGNOSIS — M542 Cervicalgia: Secondary | ICD-10-CM | POA: Insufficient documentation

## 2023-06-04 DIAGNOSIS — M5021 Other cervical disc displacement,  high cervical region: Secondary | ICD-10-CM | POA: Diagnosis not present

## 2023-06-10 ENCOUNTER — Other Ambulatory Visit: Payer: Self-pay | Admitting: Family Medicine

## 2023-06-13 ENCOUNTER — Other Ambulatory Visit: Payer: Self-pay | Admitting: Family Medicine

## 2023-06-13 NOTE — Telephone Encounter (Signed)
Copied from CRM [id number]. Topic: Clinical - Medication Refill >> Jun 13, 2023  3:40 PM Eunice Blase wrote: Most Recent Primary Care Visit:  Provider: [facility]  Department: [facility]  Visit Type: CLINICAL SUPPORT  Date: 06/02/2023  Medication: DULoxetine (CYMBALTA) 60 MG capsule  Has the patient contacted their pharmacy? Yes (Agent: If no, request that the patient contact the pharmacy for the refill. If patient does not wish to contact the pharmacy document the reason why and proceed with request.) (Agent: If yes, when and what did the pharmacy advise?)Needs prescription sent  Is this the correct pharmacy for this prescription? Yes If no, delete pharmacy and type the correct one.  This is the patient's preferred pharmacy:  [facility] [phone] - [place], [place] - [address] AT [facility] OF [address] [place] [address] Phone: [phone] Fax: [phone]   Has the prescription been filled recently? No  Is the patient out of the medication? Yes  Has the patient been seen for an appointment in the last year OR does the patient have an upcoming appointment? Yes  Can we respond through MyChart? Yes  Agent: Please be advised that Rx refills may take up to 3 business days. We ask that you follow-up with your pharmacy.

## 2023-06-13 NOTE — Telephone Encounter (Signed)
Pls see msg/ok to refill?

## 2023-06-15 MED ORDER — DULOXETINE HCL 60 MG PO CPEP: 60.0000 mg | ORAL_CAPSULE | Freq: Every day | ORAL | 1 refills | Status: DC

## 2023-06-21 ENCOUNTER — Ambulatory Visit (INDEPENDENT_AMBULATORY_CARE_PROVIDER_SITE_OTHER): Payer: Medicaid Other | Admitting: Orthopaedic Surgery

## 2023-06-21 ENCOUNTER — Encounter: Payer: Self-pay | Admitting: Orthopaedic Surgery

## 2023-06-21 VITALS — Ht 61.0 in | Wt 165.0 lb

## 2023-06-21 DIAGNOSIS — M4802 Spinal stenosis, cervical region: Secondary | ICD-10-CM | POA: Diagnosis not present

## 2023-06-21 DIAGNOSIS — M545 Low back pain, unspecified: Secondary | ICD-10-CM | POA: Diagnosis not present

## 2023-06-21 DIAGNOSIS — G8929 Other chronic pain: Secondary | ICD-10-CM | POA: Diagnosis not present
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
## 2023-06-24 DIAGNOSIS — Z419 Encounter for procedure for purposes other than remedying health state, unspecified: Secondary | ICD-10-CM | POA: Diagnosis not present

## 2023-06-28 ENCOUNTER — Ambulatory Visit: Payer: Medicaid Other | Admitting: Orthopaedic Surgery

## 2023-06-28 DIAGNOSIS — M4802 Spinal stenosis, cervical region: Secondary | ICD-10-CM | POA: Diagnosis not present
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
## 2023-07-05 ENCOUNTER — Ambulatory Visit (INDEPENDENT_AMBULATORY_CARE_PROVIDER_SITE_OTHER): Admitting: Family Medicine

## 2023-07-05 ENCOUNTER — Encounter: Payer: Self-pay | Admitting: Family Medicine

## 2023-07-05 VITALS — BP 132/80 | HR 103 | Temp 98.4°F | Ht 61.0 in | Wt 169.8 lb

## 2023-07-05 DIAGNOSIS — G8929 Other chronic pain: Secondary | ICD-10-CM

## 2023-07-05 DIAGNOSIS — M4802 Spinal stenosis, cervical region: Secondary | ICD-10-CM

## 2023-07-05 DIAGNOSIS — M545 Low back pain, unspecified: Secondary | ICD-10-CM | POA: Diagnosis not present

## 2023-07-05 MED ORDER — LIDOCAINE 5 % EX PTCH: 1.0000 | MEDICATED_PATCH | Freq: Two times a day (BID) | CUTANEOUS | 2 refills | Status: DC
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
## 2023-07-05 NOTE — Assessment & Plan Note (Addendum)
 Referral placed to Dr Christell Constant as recommended by Dr Ophelia Charter. Offered [facility] as resource if she decides to forego surgery. Encouraged to discuss all treatment options. Will start lidocaine patches for hopefully some additional relief. Symptoms stable, no red flags on exam.

## 2023-07-05 NOTE — Assessment & Plan Note (Signed)
 Stable without red flags on exam. Currently in discussion with orthopedics however Dr Ophelia Charter is retiring so she will need a new ortho, referral to Dr Christell Constant as recommended. Lidocaine patches ordered for additional relief.

## 2023-07-17 ENCOUNTER — Ambulatory Visit: Admitting: Family Medicine

## 2023-07-27 ENCOUNTER — Ambulatory Visit: Admitting: Orthopedic Surgery

## 2023-08-01 DIAGNOSIS — J069 Acute upper respiratory infection, unspecified: Secondary | ICD-10-CM | POA: Diagnosis not present

## 2023-08-05 DIAGNOSIS — Z419 Encounter for procedure for purposes other than remedying health state, unspecified: Secondary | ICD-10-CM | POA: Diagnosis not present

## 2023-08-16 ENCOUNTER — Other Ambulatory Visit: Payer: Self-pay | Admitting: Family Medicine

## 2023-08-25 ENCOUNTER — Encounter: Payer: Self-pay | Admitting: Radiology

## 2023-08-30 ENCOUNTER — Other Ambulatory Visit: Payer: Self-pay | Admitting: Family Medicine

## 2023-08-30 NOTE — Telephone Encounter (Signed)
 Copied from CRM [id number]. Topic: Clinical - Medication Refill >> Aug 30, 2023  3:44 PM Terence Fend E wrote: Medication: DULoxetine  (CYMBALTA ) 60 MG capsule  Has the patient contacted their pharmacy? Yes  (Agent: If no, request that the patient contact the pharmacy for the refill. If patient does not wish to contact the pharmacy document the reason why and proceed with request.) (Agent: If yes, when and what did the pharmacy advise?)  This is the patient's preferred pharmacy:  [facility] [phone] - [place], [place] - [address] AT [facility] [address] [place] [address] Phone: [phone] Fax: [phone]  Is this the correct pharmacy for this prescription? Yes pharmacy states there are no refills  If no, delete pharmacy and type the correct one.   Has the prescription been filled recently? Yes  Is the patient out of the medication? No  Has the patient been seen for an appointment in the last year OR does the patient have an upcoming appointment? Yes  Can we respond through MyChart? Yes  Agent: Please be advised that Rx refills may take up to 3 business days. We ask that you follow-up with your pharmacy.

## 2023-09-04 DIAGNOSIS — Z419 Encounter for procedure for purposes other than remedying health state, unspecified: Secondary | ICD-10-CM | POA: Diagnosis not present

## 2023-09-05 ENCOUNTER — Other Ambulatory Visit: Payer: Self-pay

## 2023-09-05 ENCOUNTER — Telehealth: Payer: Self-pay

## 2023-09-05 DIAGNOSIS — M545 Low back pain, unspecified: Secondary | ICD-10-CM

## 2023-09-05 DIAGNOSIS — F339 Major depressive disorder, recurrent, unspecified: Secondary | ICD-10-CM

## 2023-09-05 DIAGNOSIS — M4802 Spinal stenosis, cervical region: Secondary | ICD-10-CM

## 2023-09-05 MED ORDER — DULOXETINE HCL 60 MG PO CPEP: 60.0000 mg | ORAL_CAPSULE | Freq: Every day | ORAL | 1 refills | Status: DC

## 2023-09-05 NOTE — Telephone Encounter (Signed)
 Copied from CRM [id number]. Topic: Clinical - Medication Question >> Sep 05, 2023  1:23 PM [name] [name] wrote: Reason for CRM: Pt is wanting to know why her refill for Cymbalta  was refused. Pt is wanting a call back because she will be out by the end of the week.

## 2023-10-02 ENCOUNTER — Other Ambulatory Visit (INDEPENDENT_AMBULATORY_CARE_PROVIDER_SITE_OTHER): Payer: Self-pay

## 2023-10-02 ENCOUNTER — Ambulatory Visit: Admitting: Orthopedic Surgery

## 2023-10-02 VITALS — BP 125/78 | HR 103 | Ht 61.0 in | Wt 173.2 lb

## 2023-10-02 DIAGNOSIS — Z720 Tobacco use: Secondary | ICD-10-CM | POA: Diagnosis not present

## 2023-10-02 DIAGNOSIS — M542 Cervicalgia: Secondary | ICD-10-CM

## 2023-10-02 NOTE — Progress Notes (Signed)
 Orthopedic Spine Surgery Office Note  Assessment: Patient is a 53 y.o. female with 3 issues:  1) neck pain that radiates into bilateral shoulders and lateral arms consistent with radiculopathy.  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 Discussed that the conservative treatments include:  -activity modification  -physical therapy  -over the counter pain medications  -medrol dosepak  -cervical steroid injections -Patient's neck and radicular pain is currently tolerable with over-the-counter medications, so we will continue to monitor for now.  Can continue with those medications -For her trigger fingers, discussed injections and surgery.  Told her I refer her to my partner if she is interested in surgery.  She is not interested in either at this time and wants to continue to monitor -Patient should return to office on an as-needed basis  Spent 5 minutes talking with her about smoking cessation.  Explained that has multiple health detriments.  From a surgeons perspective, increases the chances of infection, nonunion, and wound healing issues.  For that reason, I told her I have all patient's quit nicotine containing products prior to any surgical intervention. She is seeing her primary care provider soon and is going to ask her about options to help with nicotine cessation.   ___________________________________________________________________________   History:  Patient is a 53 y.o. female who presents today for cervical spine.  Patient has had on and off neck pain that radiates into her bilateral upper extremities for several years.  She feels like starting in the neck and going into the shoulders and lateral arms.  It does not radiate past the mid humerus.  She feels it on both sides.  She  also notices a numbness over the area of the trapezius.  There is no trauma or injury that preceded the onset of her pain.  She has tried several treatments over the years.  She also reports triggering of her bilateral ring fingers.  She notes it mostly at night.  Sometimes it wakes her up.  She has not tried any treatment for these.  Weakness: Denies Difficulty with fine motor skills (e.g., buttoning shirts, handwriting): Yes, this has been chronic.  She says she has been dropping objects.  There have been no recent changes.  She does not have issues with buttoning shirts Symptoms of imbalance: Denies Paresthesias and numbness: Yes, gets numbness over the trapezius bilaterally.  No other numbness or paresthesias Bowel or bladder incontinence: Denies Saddle anesthesia: Denies  Treatments tried: PT, Tylenol , Aleve, prednisone , steroid injections  Review of systems: Denies fevers and chills, night sweats, unexplained weight loss, history of cancer, pain that wakes her at night  Past medical history: Depression Psoriasis Endometriosis Chronic pain Scoliosis  Allergies: ibuprofen, meperidine  Past surgical history:  Hysterectomy Salpingoophorectomy  Social history: Denies use of nicotine product (smoking, vaping, patches, smokeless) Alcohol use: rare Denies recreational drug use   Physical Exam:  BMI of 32.7  General: no acute distress, appears stated age Neurologic: alert, answering questions appropriately, following commands Respiratory: unlabored breathing on room air, symmetric chest rise Psychiatric: appropriate affect, normal cadence to speech   MSK (spine):  -Strength exam      Left  Right Grip strength                5/5  5/5 Interosseus   5/5   5/5  Wrist extension  5/5  5/5 Wrist flexion   5/5  5/5 Elbow flexion   5/5  5/5 Deltoid    5/5  5/5  -Sensory exam    Sensation intact to light touch in C5-T1 nerve distributions of bilateral upper  extremities  -Brachioradialis DTR: 2/4 on the left, 2/4 on the right -Biceps DTR: 2/4 on the left, 2/4 on the right  -Spurling: negative bilaterally -Hoffman sign: negative bilaterally -Clonus: no beats bilaterally -Interosseous wasting: none seen -Grip and release test: negative  -Romberg: negative -Gait: normal  Left shoulder exam: no pain through range of motion Right shoulder exam: no pain through range of motion  Patient able to recreate triggering of the ring finger bilaterally. TTP over the A1 pulley of bilaterally ring fingers. No other TTP. No other triggering seen.   Imaging: XRs of the cervical spine from 10/02/2023 were independently reviewed and interpreted, showing disc height loss at C5/6. No other significant degenerative changes seen. No evidence of instability on flexion/extension views. No fracture or dislocation seen.   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 No other significant stenosis seen. No T2 cord signal change.    Patient name: [name] [name] Patient MRN: [id number] Date of visit: 10/02/23

## 2023-10-05 DIAGNOSIS — Z419 Encounter for procedure for purposes other than remedying health state, unspecified: Secondary | ICD-10-CM | POA: Diagnosis not present

## 2023-10-08 ENCOUNTER — Other Ambulatory Visit: Payer: Self-pay | Admitting: Family Medicine

## 2023-10-09 ENCOUNTER — Encounter: Payer: Self-pay | Admitting: Family Medicine

## 2023-10-09 ENCOUNTER — Ambulatory Visit (INDEPENDENT_AMBULATORY_CARE_PROVIDER_SITE_OTHER): Payer: Medicaid Other | Admitting: Family Medicine

## 2023-10-09 VITALS — BP 115/87 | HR 115 | Ht 61.0 in | Wt 174.4 lb

## 2023-10-09 DIAGNOSIS — G8929 Other chronic pain: Secondary | ICD-10-CM

## 2023-10-09 DIAGNOSIS — F339 Major depressive disorder, recurrent, unspecified: Secondary | ICD-10-CM

## 2023-10-09 DIAGNOSIS — M545 Low back pain, unspecified: Secondary | ICD-10-CM

## 2023-10-09 DIAGNOSIS — L4050 Arthropathic psoriasis, unspecified: Secondary | ICD-10-CM | POA: Diagnosis not present

## 2023-10-09 DIAGNOSIS — R000 Tachycardia, unspecified: Secondary | ICD-10-CM

## 2023-10-09 DIAGNOSIS — Z23 Encounter for immunization: Secondary | ICD-10-CM | POA: Diagnosis not present

## 2023-10-09 MED ORDER — ZORYVE 0.3 % EX CREA: 1.0000 | TOPICAL_CREAM | Freq: Every day | CUTANEOUS | 1 refills | Status: DC

## 2023-10-09 MED ORDER — BUSPIRONE HCL 15 MG PO TABS: 15.0000 mg | ORAL_TABLET | Freq: Two times a day (BID) | ORAL | 1 refills | Status: DC

## 2023-10-09 NOTE — Assessment & Plan Note (Signed)
 uncontrolled on Cymbalta  60mg  daily and Buspar  10mg  BID, increase Buspar  to 15mg  BID. She is also working on pain management. Denies SI/HI. Follow up in 6 weeks or sooner if needed.      10/09/2023    1:01 PM 07/05/2023    2:41 PM 04/10/2023    8:29 AM 02/09/2023   12:01 PM  Depression screen PHQ 2/9  Decreased Interest 2 1 0 1  Down, Depressed, Hopeless 2 1 1 2   PHQ - 2 Score 4 2 1 3   Altered sleeping 2 1 1 3   Tired, decreased energy 2 2 1 3   Change in appetite 2  1 0  Feeling bad or failure about yourself  2 1 1  0  Trouble concentrating 2 0 0 2  Moving slowly or fidgety/restless 1 0 0 3  Suicidal thoughts 0 0 0 0  PHQ-9 Score 15 6 5 14   Difficult doing work/chores Very difficult  Somewhat difficult Very difficult

## 2023-10-09 NOTE — Assessment & Plan Note (Signed)
 Referral to ortho for further management. Has failed steroid injections, PT, and NSAIDs. No red flags or new or worsening symtpoms

## 2023-10-09 NOTE — Assessment & Plan Note (Signed)
 Uncontrolled with generalized plaques especially to upper and lower extremities. Failed elocon . Will start Zoryve 0.3% application daily and refer to derm.

## 2023-10-09 NOTE — Progress Notes (Signed)
 Subjective:  HPI: [name] [name] is a 53 y.o. female presenting on 10/09/2023 for Medical Management of Chronic Issues (Med refills/Referral to Dermatology, and Orthopedics/Wt loss concerns. )   HPI Patient is in today for follow up for depression. She reports today her weekends are hard because she is at home taking care of her elderly parents. Other concerns include weight gain, she has trouble controlling her appetite. Exercise is limited by low back pain due to DDD, scoliosis, and arthritis. Has tried Lidocaine , Meloxicam, steroid injections with limited relief, PT. Would like referral back to ortho for low back pain. Denies saddle numbness, lower extremity weakness, numbness, tingling, incontinence of urine or stool. Other concerns include uncontrolled psoriasis without relief from Elocon .   DEPRESSION Mood status: uncontrolled Satisfied with current treatment?: no Symptom severity: moderate  Duration of current treatment : chronic Side effects: no Medication compliance: excellent compliance Psychotherapy/counseling: no in the past Previous psychiatric medications: buspar , duloxetine  Depressed mood: yes Anxious mood: yes Anhedonia: yes Significant weight loss or gain: no Insomnia: no hard to stay asleep Fatigue: yes Feelings of worthlessness or guilt: yes Impaired concentration/indecisiveness: yes Suicidal ideations: no Hopelessness: yes Crying spells: no    10/09/2023    1:01 PM 07/05/2023    2:41 PM 04/10/2023    8:29 AM 02/09/2023   12:01 PM  Depression screen PHQ 2/9  Decreased Interest 2 1 0 1  Down, Depressed, Hopeless 2 1 1 2   PHQ - 2 Score 4 2 1 3   Altered sleeping 2 1 1 3   Tired, decreased energy 2 2 1 3   Change in appetite 2  1 0  Feeling bad or failure about yourself  2 1 1  0  Trouble concentrating 2 0 0 2  Moving slowly or fidgety/restless 1 0 0 3  Suicidal thoughts 0 0 0 0  PHQ-9 Score 15 6 5 14   Difficult doing work/chores Very difficult  Somewhat  difficult Very difficult     Review of Systems  All other systems reviewed and are negative.   Relevant past medical history reviewed and updated as indicated.   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    Endometriosis    Fibroid uterus    Psoriasis    Scoliosis      Past Surgical History:  Procedure Laterality Date   ABDOMINAL HYSTERECTOMY N/A 01/21/2015   Procedure: HYSTERECTOMY ABDOMINAL WITH IVP;  Surgeon: Wendelyn Halter, MD;  Location: [facility];  Service: Gynecology;  Laterality: N/A;   BREAST BIOPSY Left 04/28/2023   US  LT BREAST BX W LOC DEV 1ST LESION IMG BX SPEC US  GUIDE 04/28/2023 [facility]   SALPINGOOPHORECTOMY Bilateral 01/21/2015   Procedure: BILATERAL SALPINGO OOPHORECTOMY;  Surgeon: Wendelyn Halter, MD;  Location: [facility];  Service: Gynecology;  Laterality: Bilateral;    Allergies and medications reviewed and updated.   Current Outpatient Medications:    Cholecalciferol (VITAMIN D -3) 125 MCG (5000 UT) TABS, Take by mouth., Disp: , Rfl:    DULoxetine  (CYMBALTA ) 60 MG capsule, Take 1 capsule (60 mg total) by mouth daily. Per pt only take 1 before bedtime, Disp: 90 capsule, Rfl: 1   lidocaine  (LIDODERM ) 5 %, Place 1 patch onto the skin every 12 (twelve) hours. Remove & Discard patch within 12 hours or as directed by MD, Disp: 30 patch, Rfl: 2   Roflumilast (ZORYVE) 0.3 % CREA, Apply  1 Application topically daily., Disp: 60 g, Rfl: 1   busPIRone  (BUSPAR ) 15 MG tablet, Take 1 tablet (15 mg total) by mouth 2 (two) times daily., Disp: 180 tablet, Rfl: 1   mometasone  (ELOCON ) 0.1 % cream, Apply to skin lesions twice daily (Patient not taking: Reported on 10/09/2023), Disp: 45 g, Rfl: 2  Allergies  Allergen Reactions   Ibuprofen Diarrhea and Other (See Comments)    reflux   Meperidine Hcl Nausea And Vomiting    Objective:   BP 115/87   Pulse (!) 115   Ht 5'  1 (1.549 m)   Wt 174 lb 6.4 oz (79.1 kg)   LMP 09/22/2014   SpO2 95%   BMI 32.95 kg/m      10/09/2023   12:57 PM 10/02/2023    8:30 AM 07/05/2023    2:33 PM  Vitals with BMI  Height 5' 1 5' 1 5' 1  Weight 174 lbs 6 oz 173 lbs 3 oz 169 lbs 13 oz  BMI 32.97 32.74 32.1  Systolic 115 125 161  Diastolic 87 78 80  Pulse 115 103 103     Physical Exam Vitals and nursing note reviewed.  Constitutional:      Appearance: Normal appearance. She is normal weight.  HENT:     Head: Normocephalic and atraumatic.   Cardiovascular:     Rate and Rhythm: Normal rate and regular rhythm.     Pulses: Normal pulses.     Heart sounds: Normal heart sounds.  Pulmonary:     Effort: Pulmonary effort is normal.     Breath sounds: Normal breath sounds.   Skin:    General: Skin is warm and dry.   Neurological:     General: No focal deficit present.     Mental Status: She is alert and oriented to person, place, and time. Mental status is at baseline.   Psychiatric:        Mood and Affect: Mood normal.        Behavior: Behavior normal.        Thought Content: Thought content normal.        Judgment: Judgment normal.     Assessment & Plan:  Depression, recurrent (HCC) Assessment & Plan: uncontrolled on Cymbalta  60mg  daily and Buspar  10mg  BID, increase Buspar  to 15mg  BID. She is also working on pain management. Denies SI/HI. Follow up in 6 weeks or sooner if needed.      10/09/2023    1:01 PM 07/05/2023    2:41 PM 04/10/2023    8:29 AM 02/09/2023   12:01 PM  Depression screen PHQ 2/9  Decreased Interest 2 1 0 1  Down, Depressed, Hopeless 2 1 1 2   PHQ - 2 Score 4 2 1 3   Altered sleeping 2 1 1 3   Tired, decreased energy 2 2 1 3   Change in appetite 2  1 0  Feeling bad or failure about yourself  2 1 1  0  Trouble concentrating 2 0 0 2  Moving slowly or fidgety/restless 1 0 0 3  Suicidal thoughts 0 0 0 0  PHQ-9 Score 15 6 5 14   Difficult doing work/chores Very difficult  Somewhat  difficult Very difficult      Chronic bilateral low back pain without sciatica Assessment & Plan: Referral to ortho for further management. Has failed steroid injections, PT, and NSAIDs. 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 Failed elocon . Will start Zoryve 0.3% application daily and refer to derm.   Orders: -     Ambulatory referral to Dermatology  Encounter for immunization -     Varicella-zoster vaccine IM  Tachycardia Assessment & Plan: NSR EKG with HR 99   Other orders -     busPIRone  HCl; Take 1 tablet (15 mg total) by mouth 2 (two) times daily.  Dispense: 180 tablet; Refill: 1 -     Zoryve; Apply 1 Application topically daily.  Dispense: 60 g; Refill: 1     Follow up plan: Return in about 4 months (around 02/08/2024) for annual physical with labs 1 week prior.  Jenelle Mis, FNP

## 2023-10-09 NOTE — Assessment & Plan Note (Signed)
 NSR EKG with HR 99

## 2023-10-13 ENCOUNTER — Encounter: Payer: Self-pay | Admitting: Family Medicine

## 2023-11-04 DIAGNOSIS — Z419 Encounter for procedure for purposes other than remedying health state, unspecified: Secondary | ICD-10-CM | POA: Diagnosis not present

## 2023-11-15 ENCOUNTER — Other Ambulatory Visit (INDEPENDENT_AMBULATORY_CARE_PROVIDER_SITE_OTHER): Payer: Self-pay

## 2023-11-15 ENCOUNTER — Ambulatory Visit: Admitting: Orthopedic Surgery

## 2023-11-15 VITALS — BP 118/67 | HR 102 | Ht 60.0 in | Wt 174.4 lb

## 2023-11-15 DIAGNOSIS — M545 Low back pain, unspecified: Secondary | ICD-10-CM

## 2023-11-15 DIAGNOSIS — G8929 Other chronic pain: Secondary | ICD-10-CM

## 2023-11-15 NOTE — Progress Notes (Signed)
 Orthopedic Spine Surgery Office Note  Assessment: Patient is a 53 y.o. female with chronic, low back pain.  No radicular symptoms   Plan: -Patient has tried PT, Tylenol , lidocaine  patches, oral steroids, narcotics  -She can continue with lidocaine  patches and Tylenol  up to 1000 mg 3 times daily -Discussed looking for other employment or asking her employer for duties without as much lifting and physical exertion -Since she has tried multiple conservative treatments over the last 3 months without any relief of her pain, I recommend an MRI of the lumbar spine to evaluate further.  Will likely discuss injection as a another conservative treatment to try -Patient should return to office in 4 weeks, x-rays at next visit: none   Patient expressed understanding of the plan and all questions were answered to the patient's satisfaction.   ___________________________________________________________________________   History:  Patient is a 53 y.o. female who presents today for lumbar spine.  Patient has had upper lumbar back pain for about 4 years now.  She says she feels the pain on a daily basis.  She is worse if she does more activity especially lifting activities.  She has trouble at work as a result of the pain.  She has no pain radiating to either lower extremity.  There is no trauma or injury after she had the onset of the pain.  She has tried multiple medications with my now retired partner, Dr. Barbarann.  She has not noticed any significant or lasting relief with those treatments.  Has not noticed any weakness in her lower extremities.  No bowel or bladder incontinence.  No saddle anesthesia.  Treatments tried: PT, Tylenol , lidocaine  patches, oral steroids, narcotics   Physical Exam:  General: no acute distress, appears stated age Neurologic: alert, answering questions appropriately, following commands Respiratory: unlabored breathing on room air, symmetric chest rise Psychiatric:  appropriate affect, normal cadence to speech   MSK (spine):  -Strength exam      Left  Right EHL    5/5  5/5 TA    5/5  5/5 GSC    5/5  5/5 Knee extension  5/5  5/5 Hip flexion   5/5  5/5  -Sensory exam    Sensation intact to light touch in L3-S1 nerve distributions of bilateral lower extremities  -Achilles DTR: 2/4 on the left, 2/4 on the right -Patellar tendon DTR: 2/4 on the left, 2/4 on the right  -Straight leg raise: negative bilaterally -Clonus: no beats bilaterally  -Left hip exam: No pain through range of motion -Right hip exam: No pain through range of motion  Imaging: XRs of the lumbar spine from 11/15/2023 were independently reviewed and interpreted, showing lumbar scoliosis with apex to the left at L1/2.  Cobb angle of 43 degrees.  No fracture or dislocation seen.  No evidence of instability on flexion/extension views.   Patient name: [name] [name] Patient MRN: [id number] Date of visit: 11/15/23

## 2023-12-05 DIAGNOSIS — Z419 Encounter for procedure for purposes other than remedying health state, unspecified: Secondary | ICD-10-CM | POA: Diagnosis not present

## 2023-12-15 ENCOUNTER — Ambulatory Visit (HOSPITAL_COMMUNITY)
Admission: RE | Admit: 2023-12-15 | Discharge: 2023-12-15 | Disposition: A | Discharge status: Home / Self Care | Source: Ambulatory Visit | Attending: Orthopedic Surgery | Admitting: Orthopedic Surgery

## 2023-12-15 DIAGNOSIS — M545 Low back pain, unspecified: Secondary | ICD-10-CM | POA: Insufficient documentation

## 2023-12-15 DIAGNOSIS — G8929 Other chronic pain: Secondary | ICD-10-CM | POA: Insufficient documentation

## 2023-12-28 ENCOUNTER — Ambulatory Visit (INDEPENDENT_AMBULATORY_CARE_PROVIDER_SITE_OTHER): Admitting: Orthopedic Surgery

## 2023-12-28 DIAGNOSIS — M47816 Spondylosis without myelopathy or radiculopathy, lumbar region: Secondary | ICD-10-CM

## 2023-12-28 NOTE — Progress Notes (Signed)
 Orthopedic Spine Surgery Office Note   Assessment: Patient is a 53 y.o. female with chronic, low back pain.  No radicular symptoms     Plan: -Patient has tried PT, Tylenol , lidocaine  patches, oral steroids, narcotics  -Discussed treatment options going forward.  After our conversation, patient wanted to try facet injections.  Referral provided to her today -She has tried multiple options so her next options would be surgical versus pain management.  Would likely recommend pain management since her main complaint is low back pain.  If she does think about surgery, would want to get a scoliosis film to see what her coronal and sagittal balance is - Patient should return to the office on an as-needed basis.  If patient does return, should get scoliosis films     Patient expressed understanding of the plan and all questions were answered to the patient's satisfaction.    ___________________________________________________________________________     History:   Patient is a 53 y.o. female who presents today for follow up on her lumbar spine.  Patient continues to have significant lower back pain.  She feels in the lower lumbar region.  She feels a below the belt line.  She does not have any pain radiating to either lower extremity.  She continues to have the pain on a daily basis.  It is still worse with activity and gets better with rest but she still does notice it at rest.  She comes in today to follow-up on her MRI.  No change in her symptoms since she was last seen.   Treatments tried: PT, Tylenol , lidocaine  patches, oral steroids, narcotics     Physical Exam:   General: no acute distress, appears stated age Neurologic: alert, answering questions appropriately, following commands Respiratory: unlabored breathing on room air, symmetric chest rise Psychiatric: appropriate affect, normal cadence to speech     MSK (spine):   -Strength exam                                                    Left                  Right EHL                              5/5                  5/5 TA                                 5/5                  5/5 GSC                             5/5                  5/5 Knee extension            5/5                  5/5 Hip flexion                    5/5  5/5   -Sensory exam                           Sensation intact to light touch in L3-S1 nerve distributions of bilateral lower extremities   Imaging: XRs of the lumbar spine from 11/15/2023 were independently reviewed and interpreted, showing lumbar scoliosis with apex to the left at L1/2.  Cobb angle of 43 degrees.  No fracture or dislocation seen.  No evidence of instability on flexion/extension views.  MRI of the lumbar spine from 12/15/2023 was independently reviewed and interpreted, showing disc height loss at L5/S1.  Facet arthropathy at L5/S1.  No significant stenosis seen centrally, in the lateral recess, or foraminally.     Patient name: [name] [name] Patient MRN: [id number] Date of visit: 12/28/23

## 2024-01-05 DIAGNOSIS — Z419 Encounter for procedure for purposes other than remedying health state, unspecified: Secondary | ICD-10-CM | POA: Diagnosis not present

## 2024-01-08 DIAGNOSIS — M5442 Lumbago with sciatica, left side: Secondary | ICD-10-CM | POA: Diagnosis not present

## 2024-01-15 ENCOUNTER — Encounter: Payer: Self-pay | Admitting: Orthopedic Surgery

## 2024-01-25 ENCOUNTER — Ambulatory Visit: Admitting: Orthopedic Surgery

## 2024-02-07 ENCOUNTER — Encounter: Payer: Self-pay | Admitting: Family Medicine

## 2024-02-07 ENCOUNTER — Other Ambulatory Visit (HOSPITAL_COMMUNITY): Payer: Self-pay

## 2024-02-07 ENCOUNTER — Ambulatory Visit: Admitting: Family Medicine

## 2024-02-07 ENCOUNTER — Telehealth: Payer: Self-pay | Admitting: Pharmacy Technician

## 2024-02-07 VITALS — BP 130/72 | HR 96 | Temp 97.3°F | Ht 60.0 in | Wt 180.0 lb

## 2024-02-07 DIAGNOSIS — F339 Major depressive disorder, recurrent, unspecified: Secondary | ICD-10-CM

## 2024-02-07 DIAGNOSIS — E669 Obesity, unspecified: Secondary | ICD-10-CM

## 2024-02-07 DIAGNOSIS — L4050 Arthropathic psoriasis, unspecified: Secondary | ICD-10-CM

## 2024-02-07 DIAGNOSIS — Z0000 Encounter for general adult medical examination without abnormal findings: Secondary | ICD-10-CM | POA: Diagnosis not present

## 2024-02-07 DIAGNOSIS — Z1211 Encounter for screening for malignant neoplasm of colon: Secondary | ICD-10-CM

## 2024-02-07 DIAGNOSIS — M4802 Spinal stenosis, cervical region: Secondary | ICD-10-CM

## 2024-02-07 DIAGNOSIS — Z0001 Encounter for general adult medical examination with abnormal findings: Secondary | ICD-10-CM

## 2024-02-07 MED ORDER — ZORYVE 0.3 % EX CREA: 1.0000 | TOPICAL_CREAM | Freq: Every day | CUTANEOUS | 1 refills | Status: AC

## 2024-02-07 MED ORDER — DULOXETINE HCL 60 MG PO CPEP: 60.0000 mg | ORAL_CAPSULE | Freq: Every day | ORAL | 1 refills | Status: AC

## 2024-02-07 NOTE — Telephone Encounter (Signed)
 Pharmacy Patient [employer]ocate Encounter   Received notification from CoverMyMeds that prior authorization for Zoryve  0.3% cream is required/requested.   Insurance verification completed.   The patient is insured through [facility] MEDICAID.   Per test claim: PA required; PA started via CoverMyMeds. KEY [id number] . Waiting for clinical questions to populate.

## 2024-02-07 NOTE — Progress Notes (Signed)
 Subjective:  HPI: [name] [name] is a 53 y.o. female presenting on 02/07/2024 for Medical Management of Chronic Issues (3 month f/u )   HPI Patient is in today for chronic condition management and is due for CPE. PMH includes depression, psoriatic arthritis, DDD, and scoliosis.   Depression: on Cymbalta  60mg  daily, buspar  15mg  BID, well controlled     02/07/2024    8:11 AM 10/09/2023    1:01 PM 07/05/2023    2:41 PM 04/10/2023    8:29 AM 02/09/2023   12:01 PM  Depression screen PHQ 2/9  Decreased Interest 1 2 1  0 1  Down, Depressed, Hopeless 2 2 1 1 2   PHQ - 2 Score 3 4 2 1 3   Altered sleeping 2 2 1 1 3   Tired, decreased energy 2 2 2 1 3   Change in appetite 2 2  1  0  Feeling bad or failure about yourself  0 2 1 1  0  Trouble concentrating 0 2 0 0 2  Moving slowly or fidgety/restless 0 1 0 0 3  Suicidal thoughts 0 0 0 0 0  PHQ-9 Score 9 15 6 5 14   Difficult doing work/chores Somewhat difficult Very difficult  Somewhat difficult Very difficult    Psoriatic Arthritis: scheduled with Dermatology, Elocon  was cost prohibitive, has tried hydrocortisone cream  DDD: followed by Orthopedics Dr Georgina, would like spinal injections but denied by insurance, completed PT last November and December and gleaned no real benefit, continues to do home PT exercises  Health Maintenance  Topic Date Due   Fecal DNA (Cologuard)  Never done   COVID-19 Vaccine (1 - 2025-26 season) 02/23/2024 (Originally 12/25/2023)   Influenza Vaccine  07/23/2024 (Originally 11/24/2023)   Pneumococcal Vaccine: 50+ Years (1 of 1 - PCV) 02/06/2025 (Originally 10/13/2020)   Hepatitis B Vaccines 19-59 Average Risk (1 of 3 - 19+ 3-dose series) 02/06/2025 (Originally 10/13/1989)   Mammogram  03/28/2025   DTaP/Tdap/Td (3 - Td or Tdap) 12/10/2030   Hepatitis C Screening  Completed   HIV Screening  Completed   Zoster Vaccines- Shingrix   Completed   HPV VACCINES  Aged Out   Meningococcal B Vaccine  Aged Out     Review of  Systems  Constitutional: Negative.   HENT: Negative.    Eyes: Negative.   Respiratory: Negative.    Cardiovascular: Negative.   Gastrointestinal: Negative.   Genitourinary: Negative.   Musculoskeletal:  Positive for back pain.  Skin:  Positive for rash.  Neurological: Negative.   Endo/Heme/Allergies: Negative.   Psychiatric/Behavioral:  Positive for depression.   All other systems reviewed and are negative.   Relevant past medical history reviewed and updated as indicated.   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    Endometriosis    Fibroid uterus    Psoriasis    Scoliosis      Past Surgical History:  Procedure Laterality Date   ABDOMINAL HYSTERECTOMY N/A 01/21/2015   Procedure: HYSTERECTOMY ABDOMINAL WITH IVP;  Surgeon: Vonn VEAR Inch, MD;  Location: [facility];  Service: Gynecology;  Laterality: N/A;   BREAST BIOPSY Left 04/28/2023   US  LT BREAST BX W LOC DEV 1ST LESION IMG BX SPEC US  GUIDE 04/28/2023 [facility]   SALPINGOOPHORECTOMY Bilateral 01/21/2015   Procedure: BILATERAL SALPINGO OOPHORECTOMY;  Surgeon: Vonn VEAR Inch, MD;  Location: [facility];  Service: Gynecology;  Laterality: Bilateral;  Allergies and medications reviewed and updated.   Current Outpatient Medications:    busPIRone  (BUSPAR ) 15 MG tablet, Take 1 tablet (15 mg total) by mouth 2 (two) times daily., Disp: 180 tablet, Rfl: 1   Cholecalciferol (VITAMIN D -3) 125 MCG (5000 UT) TABS, Take by mouth., Disp: , Rfl:    Roflumilast  (ZORYVE ) 0.3 % CREA, Apply 1 Application topically daily., Disp: 60 g, Rfl: 1   DULoxetine  (CYMBALTA ) 60 MG capsule, Take 1 capsule (60 mg total) by mouth daily. Per pt only take 1 before bedtime, Disp: 90 capsule, Rfl: 1  Allergies  Allergen Reactions   Ibuprofen Diarrhea and Other (See Comments)    reflux   Meperidine Hcl Nausea And Vomiting    Objective:   BP 130/72    Pulse 96   Temp (!) 97.3 F (36.3 C)   Ht 5' (1.524 m)   Wt 180 lb (81.6 kg)   LMP 09/22/2014   SpO2 97%   BMI 35.15 kg/m      02/07/2024    8:09 AM 11/15/2023    2:19 PM 10/09/2023   12:57 PM  Vitals with BMI  Height 5' 0 5' 0 5' 1  Weight 180 lbs 174 lbs 6 oz 174 lbs 6 oz  BMI 35.15 34.06 32.97  Systolic 130 118 884  Diastolic 72 67 87  Pulse 96 102 115     Physical Exam Vitals and nursing note reviewed.  Constitutional:      Appearance: Normal appearance. She is obese.  HENT:     Head: Normocephalic and atraumatic.     Right Ear: Tympanic membrane, ear canal and external ear normal.     Left Ear: Tympanic membrane, ear canal and external ear normal.     Nose: Nose normal.     Mouth/Throat:     Mouth: Mucous membranes are moist.     Pharynx: Oropharynx is clear.  Eyes:     Extraocular Movements: Extraocular movements intact.     Conjunctiva/sclera: Conjunctivae normal.     Pupils: Pupils are equal, round, and reactive to light.  Cardiovascular:     Rate and Rhythm: Normal rate and regular rhythm.     Pulses: Normal pulses.     Heart sounds: Normal heart sounds.  Pulmonary:     Effort: Pulmonary effort is normal.     Breath sounds: Normal breath sounds.  Abdominal:     General: Bowel sounds are normal.     Palpations: Abdomen is soft.  Musculoskeletal:        General: Normal range of motion.     Cervical back: Normal range of motion and neck supple.  Skin:    General: Skin is warm and dry.     Capillary Refill: Capillary refill takes less than 2 seconds.     Findings: Rash present. Rash is scaling.  Neurological:     General: No focal deficit present.     Mental Status: She is alert and oriented to person, place, and time. Mental status is at baseline.  Psychiatric:        Mood and Affect: Mood normal.        Behavior: Behavior normal.        Thought Content: Thought content normal.        Judgment: Judgment normal.     Assessment & Plan:   Physical exam, annual Assessment & Plan: Today your medical history was reviewed and routine physical exam with labs was performed. Recommend 150 minutes of moderate intensity  exercise weekly and consuming a well-balanced diet. Advised to stop smoking if a smoker, avoid smoking if a non-smoker, limit alcohol consumption to 1 drink per day for women and 2 drinks per day for men, and avoid illicit drug use. Counseled in mental health awareness and when to seek medical care. Vaccine maintenance discussed. Appropriate health maintenance items reviewed. Return to office in 1 year for annual physical exam.   Orders: -     Cologuard -     CBC with Differential/Platelet -     Comprehensive metabolic panel with GFR -     Lipid panel  Colon cancer screening -     Cologuard  Depression, recurrent Assessment & Plan: Continue Cymbalta  60mg  daily and Buspar  15mg  BID. She is also working on pain management. Denies SI/HI. Follow up in 6 months or sooner if needed.      02/07/2024    8:11 AM 10/09/2023    1:01 PM 07/05/2023    2:41 PM 04/10/2023    8:29 AM 02/09/2023   12:01 PM  Depression screen PHQ 2/9  Decreased Interest 1 2 1  0 1  Down, Depressed, Hopeless 2 2 1 1 2   PHQ - 2 Score 3 4 2 1 3   Altered sleeping 2 2 1 1 3   Tired, decreased energy 2 2 2 1 3   Change in appetite 2 2  1  0  Feeling bad or failure about yourself  0 2 1 1  0  Trouble concentrating 0 2 0 0 2  Moving slowly or fidgety/restless 0 1 0 0 3  Suicidal thoughts 0 0 0 0 0  PHQ-9 Score 9 15 6 5 14   Difficult doing work/chores Somewhat difficult Very difficult  Somewhat difficult Very difficult     Orders: -     DULoxetine  HCl; Take 1 capsule (60 mg total) by mouth daily. Per pt only take 1 before bedtime  Dispense: 90 capsule; Refill: 1  Psoriatic arthritis (HCC) Assessment & Plan: Uncontrolled with generalized plaques especially to upper and lower extremities. Failed elocon  and hydrocortisone. Will start Zoryve  0.3%  application daily and refer to derm.    Foraminal stenosis of cervical region Assessment & Plan: Established with Ortho Dr Georgina, recommended spinal injections vs surgery at this time however was denied by insurance and needs appeal. She has completed PT in November and December and continues home PT exercises.   Offered [facility] as resource if she decides to forego surgery. Encouraged to discuss all treatment options. Will start lidocaine  patches for hopefully some additional relief. Symptoms stable, no red flags on exam.    Obesity (BMI 35.0-39.9 without comorbidity) Assessment & Plan: Counseled on importance of weight management for overall health. Encouraged low calorie, heart healthy diet and moderate intensity exercise 150 minutes weekly. This is 3-5 times weekly for 30-50 minutes each session. Goal should be pace of 3 miles/hours, or walking 1.5 miles in 30 minutes and include strength training.   Other orders -     Zoryve ; Apply 1 Application topically daily.  Dispense: 60 g; Refill: 1     Follow up plan: Return in about 6 months (around 08/07/2024) for chronic follow-up with labs 1 week prior.  Jeoffrey GORMAN Barrio, FNP

## 2024-02-07 NOTE — Assessment & Plan Note (Signed)
 Counseled on importance of weight management for overall health. Encouraged low calorie, heart healthy diet and moderate intensity exercise 150 minutes weekly. This is 3-5 times weekly for 30-50 minutes each session. Goal should be pace of 3 miles/hours, or walking 1.5 miles in 30 minutes and include strength training.

## 2024-02-07 NOTE — Assessment & Plan Note (Signed)
 Uncontrolled with generalized plaques especially to upper and lower extremities. Failed elocon  and hydrocortisone. Will start Zoryve  0.3% application daily and refer to derm.

## 2024-02-07 NOTE — Telephone Encounter (Signed)
 Pharmacy Patient Advocate Encounter  Received notification from [facility] MEDICA[employer] that Prior Authorization for Zoryve  0.3% cream has been DENIED.  Full denial letter will be uploaded to the media tab. See denial reason below.    PA #/Case ID/Reference #:  [id number]

## 2024-02-07 NOTE — Assessment & Plan Note (Signed)
 Continue Cymbalta  60mg  daily and Buspar  15mg  BID. She is also working on pain management. Denies SI/HI. Follow up in 6 months or sooner if needed.      02/07/2024    8:11 AM 10/09/2023    1:01 PM 07/05/2023    2:41 PM 04/10/2023    8:29 AM 02/09/2023   12:01 PM  Depression screen PHQ 2/9  Decreased Interest 1 2 1  0 1  Down, Depressed, Hopeless 2 2 1 1 2   PHQ - 2 Score 3 4 2 1 3   Altered sleeping 2 2 1 1 3   Tired, decreased energy 2 2 2 1 3   Change in appetite 2 2  1  0  Feeling bad or failure about yourself  0 2 1 1  0  Trouble concentrating 0 2 0 0 2  Moving slowly or fidgety/restless 0 1 0 0 3  Suicidal thoughts 0 0 0 0 0  PHQ-9 Score 9 15 6 5 14   Difficult doing work/chores Somewhat difficult Very difficult  Somewhat difficult Very difficult

## 2024-02-07 NOTE — Assessment & Plan Note (Signed)
 Established with Ortho Dr Georgina, recommended spinal injections vs surgery at this time however was denied by insurance and needs appeal. She has completed PT in November and December and continues home PT exercises.   Offered [facility] as resource if she decides to forego surgery. Encouraged to discuss all treatment options. Will start lidocaine  patches for hopefully some additional relief. Symptoms stable, no red flags on exam.

## 2024-02-07 NOTE — Assessment & Plan Note (Signed)

## 2024-02-08 ENCOUNTER — Ambulatory Visit: Payer: Self-pay | Admitting: Family Medicine

## 2024-02-08 LAB — COMPREHENSIVE METABOLIC PANEL WITH GFR
AG Ratio: 2 (calc) (ref 1.0–2.5)
ALT: 21 U/L (ref 6–29)
AST: 18 U/L (ref 10–35)
Albumin: 4.8 g/dL (ref 3.6–5.1)
Alkaline phosphatase (APISO): 92 U/L (ref 37–153)
BUN: 11 mg/dL (ref 7–25)
CO2: 28 mmol/L (ref 20–32)
Calcium: 10.2 mg/dL (ref 8.6–10.4)
Chloride: 105 mmol/L (ref 98–110)
Creat: 0.66 mg/dL (ref 0.50–1.03)
Globulin: 2.4 g/dL (ref 1.9–3.7)
Glucose, Bld: 110 mg/dL — ABNORMAL HIGH (ref 65–99)
Potassium: 4.3 mmol/L (ref 3.5–5.3)
Sodium: 141 mmol/L (ref 135–146)
Total Bilirubin: 0.4 mg/dL (ref 0.2–1.2)
Total Protein: 7.2 g/dL (ref 6.1–8.1)
eGFR: 105 mL/min/1.73m2 (ref >=60)

## 2024-02-08 LAB — CBC WITH DIFFERENTIAL/PLATELET
Absolute Lymphocytes: 2106 {cells}/uL (ref 850–3900)
Absolute Monocytes: 525 {cells}/uL (ref 200–950)
Basophils Absolute: 38 {cells}/uL (ref 0–200)
Basophils Relative: 0.6 %
Eosinophils Absolute: 102 {cells}/uL (ref 15–500)
Eosinophils Relative: 1.6 %
HCT: 41.2 % (ref 35.0–45.0)
Hemoglobin: 13.9 g/dL (ref 11.7–15.5)
MCH: 29.3 pg (ref 27.0–33.0)
MCHC: 33.7 g/dL (ref 32.0–36.0)
MCV: 86.9 fL (ref 80.0–100.0)
MPV: 9.6 fL (ref 7.5–12.5)
Monocytes Relative: 8.2 %
Neutro Abs: 3629 {cells}/uL (ref 1500–7800)
Neutrophils Relative %: 56.7 %
Platelets: 255 Thousand/uL (ref 140–400)
RBC: 4.74 Million/uL (ref 3.80–5.10)
RDW: 13.4 % (ref 11.0–15.0)
Total Lymphocyte: 32.9 %
WBC: 6.4 Thousand/uL (ref 3.8–10.8)

## 2024-02-08 LAB — LIPID PANEL
Cholesterol: 248 mg/dL — ABNORMAL HIGH (ref <200)
HDL: 40 mg/dL — ABNORMAL LOW (ref >=50)
LDL Cholesterol (Calc): 167 mg/dL — ABNORMAL HIGH
Non-HDL Cholesterol (Calc): 208 mg/dL — ABNORMAL HIGH (ref <130)
Total CHOL/HDL Ratio: 6.2 (calc) — ABNORMAL HIGH (ref <5.0)
Triglycerides: 242 mg/dL — ABNORMAL HIGH (ref <150)

## 2024-02-09 ENCOUNTER — Other Ambulatory Visit (HOSPITAL_COMMUNITY): Payer: Self-pay

## 2024-02-26 ENCOUNTER — Encounter: Payer: Self-pay | Admitting: Radiology

## 2024-04-03 ENCOUNTER — Other Ambulatory Visit: Payer: Self-pay | Admitting: Family Medicine

## 2024-05-14 ENCOUNTER — Ambulatory Visit: Admitting: Physician Assistant

## 2024-05-14 ENCOUNTER — Encounter: Payer: Self-pay | Admitting: Physician Assistant

## 2024-05-14 VITALS — BP 145/92 | HR 103

## 2024-05-14 DIAGNOSIS — L409 Psoriasis, unspecified: Secondary | ICD-10-CM | POA: Diagnosis not present

## 2024-05-14 DIAGNOSIS — M1990 Unspecified osteoarthritis, unspecified site: Secondary | ICD-10-CM

## 2024-05-14 MED ORDER — TRIAMCINOLONE ACETONIDE 0.1 % EX OINT: 1.0000 | TOPICAL_OINTMENT | Freq: Every day | CUTANEOUS | 2 refills | Status: AC | PRN

## 2024-05-14 MED ORDER — CLOBETASOL PROPIONATE 0.05 % EX OINT: 1.0000 | TOPICAL_OINTMENT | Freq: Two times a day (BID) | CUTANEOUS | 2 refills | Status: AC

## 2024-05-14 NOTE — Patient Instructions (Signed)

## 2024-05-14 NOTE — Progress Notes (Signed)
" ° ° °  Follow-Up Visit   Subjective  [name] [name] is a 54 y.o. female NEW PATIENT who presents for the following: Psoriasis and Psoriatic Arthritis - Pt reports she has been recently diagnosed with Psoriatic Arthritis. Theses areas has been present for seven years located all over body and ears.Pt reports PCP prescribed Roflumilast , and she has not picked up do to high expense. She has a lot of joint pain. She also has scoliosis, degenerative disc disease and depression. Has NEVER seen Dermatology or Rheumatology.  Does vape everyday.    The following portions of the chart were reviewed this encounter and updated as appropriate: medications, allergies, medical history  Review of Systems:  No other skin or systemic complaints except as noted in HPI or Assessment and Plan.    Objective  Well appearing patient in no apparent distress; mood and affect are within normal limits.   A focused examination was performed of the following areas: Trunk, extremities, scalp, ears   Relevant exam findings are noted in the Assessment and Plan.    Assessment & Plan   PSORIASIS Exam: Well-demarcated erythematous papules/plaques with silvery scale, guttate pink scaly papules. 25% BSA.    Psoriasis is a chronic non-curable, but treatable genetic/hereditary disease that may have other systemic features affecting other organ systems such as joints (Psoriatic Arthritis). It is associated with an increased risk of inflammatory bowel disease, heart disease, non-alcoholic fatty liver disease, and depression.  Treatments include light and laser treatments; topical medications; and systemic medications including oral and injectables.  Treatment Plan: Recommend she be evaluated for psoriatic arthritis by a rheumatologist as she has multiple reasons for skeletal pain and questionable loss of joints at her thumbs. If indeed she does have PsA - I do think she would benefit from a systemic treatment to treat both  her skin and joints.   Start clobetasol  and triamcinolone  as directed.    PSORIASIS   This Visit - Ambulatory referral to Rheumatology ARTHRITIS    Return in about 4 months (around 09/11/2024) for Psoriasis.   Documentation: I have reviewed the above documentation for accuracy and completeness, and I agree with the above.  Rajohn Henery K, PA-C    "

## 2024-07-31 ENCOUNTER — Other Ambulatory Visit

## 2024-08-07 ENCOUNTER — Ambulatory Visit: Admitting: Family Medicine

## 2024-08-08 ENCOUNTER — Ambulatory Visit: Admitting: Internal Medicine

## 2024-09-11 ENCOUNTER — Ambulatory Visit: Admitting: Physician Assistant

## 2025-02-10 ENCOUNTER — Encounter: Admitting: Family Medicine
